# Patient Record
Sex: Female | Born: 1960 | Race: White | Hispanic: No | Smoking: Former smoker
Health system: Southern US, Community
[De-identification: ages and names within clinical notes are randomized; demographics above are authoritative.]

## PROBLEM LIST (undated history)

## (undated) DIAGNOSIS — J449 Chronic obstructive pulmonary disease, unspecified: Secondary | ICD-10-CM

## (undated) DIAGNOSIS — I10 Essential (primary) hypertension: Secondary | ICD-10-CM

## (undated) DIAGNOSIS — E119 Type 2 diabetes mellitus without complications: Secondary | ICD-10-CM

## (undated) HISTORY — PX: ABDOMINAL HYSTERECTOMY: SHX81

## (undated) HISTORY — PX: KNEE SURGERY: SHX244

## (undated) HISTORY — PX: TONSILLECTOMY: SUR1361

---

## 2020-06-01 ENCOUNTER — Emergency Department (HOSPITAL_COMMUNITY): Payer: Medicare Other

## 2020-06-01 ENCOUNTER — Encounter (HOSPITAL_COMMUNITY): Payer: Self-pay

## 2020-06-01 ENCOUNTER — Inpatient Hospital Stay (HOSPITAL_COMMUNITY)
Admission: EM | Admit: 2020-06-01 | Discharge: 2020-06-10 | DRG: 935 | Disposition: A | Discharge status: Skilled Nursing Facility | Payer: Medicare Other | Attending: Surgery | Admitting: Surgery

## 2020-06-01 DIAGNOSIS — E119 Type 2 diabetes mellitus without complications: Secondary | ICD-10-CM | POA: Diagnosis present

## 2020-06-01 DIAGNOSIS — T300 Burn of unspecified body region, unspecified degree: Secondary | ICD-10-CM | POA: Diagnosis not present

## 2020-06-01 DIAGNOSIS — I10 Essential (primary) hypertension: Secondary | ICD-10-CM | POA: Diagnosis present

## 2020-06-01 DIAGNOSIS — T2029XA Burn of second degree of multiple sites of head, face, and neck, initial encounter: Principal | ICD-10-CM | POA: Diagnosis present

## 2020-06-01 DIAGNOSIS — Z6841 Body Mass Index (BMI) 40.0 and over, adult: Secondary | ICD-10-CM

## 2020-06-01 DIAGNOSIS — Z23 Encounter for immunization: Secondary | ICD-10-CM

## 2020-06-01 DIAGNOSIS — Z9981 Dependence on supplemental oxygen: Secondary | ICD-10-CM

## 2020-06-01 DIAGNOSIS — S0501XA Injury of conjunctiva and corneal abrasion without foreign body, right eye, initial encounter: Secondary | ICD-10-CM | POA: Diagnosis present

## 2020-06-01 DIAGNOSIS — R300 Dysuria: Secondary | ICD-10-CM | POA: Diagnosis not present

## 2020-06-01 DIAGNOSIS — J449 Chronic obstructive pulmonary disease, unspecified: Secondary | ICD-10-CM | POA: Diagnosis present

## 2020-06-01 DIAGNOSIS — H548 Legal blindness, as defined in USA: Secondary | ICD-10-CM | POA: Diagnosis present

## 2020-06-01 DIAGNOSIS — Z01818 Encounter for other preprocedural examination: Secondary | ICD-10-CM

## 2020-06-01 DIAGNOSIS — T310 Burns involving less than 10% of body surface: Secondary | ICD-10-CM | POA: Diagnosis present

## 2020-06-01 DIAGNOSIS — N179 Acute kidney failure, unspecified: Secondary | ICD-10-CM | POA: Diagnosis present

## 2020-06-01 DIAGNOSIS — Z20822 Contact with and (suspected) exposure to covid-19: Secondary | ICD-10-CM | POA: Diagnosis present

## 2020-06-01 DIAGNOSIS — Z90710 Acquired absence of both cervix and uterus: Secondary | ICD-10-CM

## 2020-06-01 DIAGNOSIS — T2030XA Burn of third degree of head, face, and neck, unspecified site, initial encounter: Secondary | ICD-10-CM

## 2020-06-01 DIAGNOSIS — J9622 Acute and chronic respiratory failure with hypercapnia: Secondary | ICD-10-CM | POA: Diagnosis not present

## 2020-06-01 DIAGNOSIS — Z4659 Encounter for fitting and adjustment of other gastrointestinal appliance and device: Secondary | ICD-10-CM

## 2020-06-01 DIAGNOSIS — J9611 Chronic respiratory failure with hypoxia: Secondary | ICD-10-CM | POA: Diagnosis present

## 2020-06-01 DIAGNOSIS — Z87891 Personal history of nicotine dependence: Secondary | ICD-10-CM

## 2020-06-01 HISTORY — DX: Essential (primary) hypertension: I10

## 2020-06-01 HISTORY — DX: Chronic obstructive pulmonary disease, unspecified: J44.9

## 2020-06-01 HISTORY — DX: Type 2 diabetes mellitus without complications: E11.9

## 2020-06-01 LAB — PROTIME-INR
INR: 1 (ref 0.8–1.2)
Prothrombin Time: 12.7 seconds (ref 11.4–15.2)

## 2020-06-01 LAB — I-STAT CHEM 8, ED
BUN: 26 mg/dL — ABNORMAL HIGH (ref 6–20)
Calcium, Ion: 1.11 mmol/L — ABNORMAL LOW (ref 1.15–1.40)
Chloride: 99 mmol/L (ref 98–111)
Creatinine, Ser: 1.2 mg/dL — ABNORMAL HIGH (ref 0.44–1.00)
Glucose, Bld: 121 mg/dL — ABNORMAL HIGH (ref 70–99)
HCT: 37 % (ref 36.0–46.0)
Hemoglobin: 12.6 g/dL (ref 12.0–15.0)
Potassium: 4.5 mmol/L (ref 3.5–5.1)
Sodium: 141 mmol/L (ref 135–145)
TCO2: 33 mmol/L — ABNORMAL HIGH (ref 22–32)

## 2020-06-01 LAB — CBC
HCT: 40.5 % (ref 36.0–46.0)
Hemoglobin: 12 g/dL (ref 12.0–15.0)
MCH: 28.4 pg (ref 26.0–34.0)
MCHC: 29.6 g/dL — ABNORMAL LOW (ref 30.0–36.0)
MCV: 95.7 fL (ref 80.0–100.0)
Platelets: 145 10*3/uL — ABNORMAL LOW (ref 150–400)
RBC: 4.23 MIL/uL (ref 3.87–5.11)
RDW: 13.6 % (ref 11.5–15.5)
WBC: 11.1 10*3/uL — ABNORMAL HIGH (ref 4.0–10.5)
nRBC: 0 % (ref 0.0–0.2)

## 2020-06-01 LAB — COMPREHENSIVE METABOLIC PANEL
ALT: 17 U/L (ref 0–44)
AST: 15 U/L (ref 15–41)
Albumin: 3.3 g/dL — ABNORMAL LOW (ref 3.5–5.0)
Alkaline Phosphatase: 96 U/L (ref 38–126)
Anion gap: 10 (ref 5–15)
BUN: 21 mg/dL — ABNORMAL HIGH (ref 6–20)
CO2: 31 mmol/L (ref 22–32)
Calcium: 8.8 mg/dL — ABNORMAL LOW (ref 8.9–10.3)
Chloride: 99 mmol/L (ref 98–111)
Creatinine, Ser: 1.14 mg/dL — ABNORMAL HIGH (ref 0.44–1.00)
GFR, Estimated: 55 mL/min — ABNORMAL LOW (ref >60)
Glucose, Bld: 128 mg/dL — ABNORMAL HIGH (ref 70–99)
Potassium: 4.5 mmol/L (ref 3.5–5.1)
Sodium: 140 mmol/L (ref 135–145)
Total Bilirubin: 0.6 mg/dL (ref 0.3–1.2)
Total Protein: 6.7 g/dL (ref 6.5–8.1)

## 2020-06-01 LAB — ETHANOL: Alcohol, Ethyl (B): <10 mg/dL (ref <10)

## 2020-06-01 LAB — LACTIC ACID, PLASMA: Lactic Acid, Venous: 1.2 mmol/L (ref 0.5–1.9)

## 2020-06-01 LAB — SAMPLE TO BLOOD BANK

## 2020-06-01 LAB — SARS CORONAVIRUS 2 BY RT PCR (HOSPITAL ORDER, PERFORMED IN ~~LOC~~ HOSPITAL LAB): SARS Coronavirus 2: NEGATIVE

## 2020-06-01 MED ORDER — ONDANSETRON HCL 4 MG/2ML IJ SOLN
4.0000 mg | Freq: Four times a day (QID) | INTRAMUSCULAR | Status: DC | PRN
  Administered 2020-06-02 – 2020-06-10 (×8): 4 mg via INTRAVENOUS
  Filled 2020-06-01 (×9): qty 2

## 2020-06-01 MED ORDER — ENOXAPARIN SODIUM 30 MG/0.3ML ~~LOC~~ SOLN: 30.0000 mg | Freq: Two times a day (BID) | SUBCUTANEOUS | Status: DC

## 2020-06-01 MED ORDER — LISINOPRIL 10 MG PO TABS
10.0000 mg | ORAL_TABLET | Freq: Every day | ORAL | Status: DC
  Administered 2020-06-02 – 2020-06-04 (×3): 10 mg via ORAL
  Filled 2020-06-01 (×3): qty 1

## 2020-06-01 MED ORDER — ATORVASTATIN CALCIUM 10 MG PO TABS
10.0000 mg | ORAL_TABLET | Freq: Every day | ORAL | Status: DC
  Administered 2020-06-02 – 2020-06-04 (×4): 10 mg via ORAL
  Filled 2020-06-01 (×4): qty 1

## 2020-06-01 MED ORDER — UMECLIDINIUM BROMIDE 62.5 MCG/INH IN AEPB
1.0000 | INHALATION_SPRAY | Freq: Every day | RESPIRATORY_TRACT | Status: DC
  Filled 2020-06-01 (×2): qty 7

## 2020-06-01 MED ORDER — HYDROMORPHONE HCL 1 MG/ML IJ SOLN
0.5000 mg | INTRAMUSCULAR | Status: DC | PRN
  Administered 2020-06-01 – 2020-06-05 (×9): 0.5 mg via INTRAVENOUS
  Filled 2020-06-01 (×10): qty 1

## 2020-06-01 MED ORDER — MIRABEGRON ER 50 MG PO TB24
50.0000 mg | ORAL_TABLET | Freq: Every day | ORAL | Status: DC
  Administered 2020-06-03 – 2020-06-04 (×2): 50 mg via ORAL
  Filled 2020-06-01 (×4): qty 1

## 2020-06-01 MED ORDER — OXYCODONE HCL 5 MG PO TABS
5.0000 mg | ORAL_TABLET | ORAL | Status: DC | PRN
  Administered 2020-06-01 – 2020-06-02 (×4): 5 mg via ORAL
  Filled 2020-06-01 (×4): qty 1

## 2020-06-01 MED ORDER — TETANUS-DIPHTH-ACELL PERTUSSIS 5-2.5-18.5 LF-MCG/0.5 IM SUSY
0.5000 mL | PREFILLED_SYRINGE | Freq: Once | INTRAMUSCULAR | Status: AC
  Administered 2020-06-01: 0.5 mL via INTRAMUSCULAR
  Filled 2020-06-01: qty 0.5

## 2020-06-01 MED ORDER — ACETAMINOPHEN 325 MG PO TABS
650.0000 mg | ORAL_TABLET | Freq: Four times a day (QID) | ORAL | Status: DC
  Administered 2020-06-01 – 2020-06-02 (×2): 650 mg via ORAL
  Filled 2020-06-01 (×2): qty 2

## 2020-06-01 MED ORDER — ONDANSETRON 4 MG PO TBDP
4.0000 mg | ORAL_TABLET | Freq: Four times a day (QID) | ORAL | Status: DC | PRN
  Administered 2020-06-09: 4 mg via ORAL
  Filled 2020-06-01: qty 1

## 2020-06-01 MED ORDER — METOPROLOL TARTRATE 5 MG/5ML IV SOLN: 5.0000 mg | Freq: Four times a day (QID) | INTRAVENOUS | Status: DC | PRN

## 2020-06-01 MED ORDER — INSULIN ASPART 100 UNIT/ML ~~LOC~~ SOLN
0.0000 [IU] | Freq: Three times a day (TID) | SUBCUTANEOUS | Status: DC
  Administered 2020-06-03 – 2020-06-09 (×5): 3 [IU] via SUBCUTANEOUS

## 2020-06-01 MED ORDER — BACITRACIN ZINC 500 UNIT/GM EX OINT
TOPICAL_OINTMENT | Freq: Two times a day (BID) | CUTANEOUS | Status: DC
  Administered 2020-06-02 – 2020-06-08 (×5): 1 via TOPICAL
  Filled 2020-06-01 (×2): qty 28.4
  Filled 2020-06-01: qty 0.9
  Filled 2020-06-01 (×2): qty 28.4
  Filled 2020-06-01: qty 1.8

## 2020-06-01 MED ORDER — ALBUTEROL SULFATE HFA 108 (90 BASE) MCG/ACT IN AERS
2.0000 | INHALATION_SPRAY | RESPIRATORY_TRACT | Status: DC | PRN
  Filled 2020-06-01: qty 6.7

## 2020-06-01 MED ORDER — ALBUTEROL SULFATE HFA 108 (90 BASE) MCG/ACT IN AERS
4.0000 | INHALATION_SPRAY | Freq: Once | RESPIRATORY_TRACT | Status: AC
  Administered 2020-06-01: 4 via RESPIRATORY_TRACT
  Filled 2020-06-01: qty 6.7

## 2020-06-01 MED ORDER — FLUTICASONE FUROATE-VILANTEROL 100-25 MCG/INH IN AEPB
1.0000 | INHALATION_SPRAY | Freq: Every day | RESPIRATORY_TRACT | Status: DC
  Filled 2020-06-01: qty 28

## 2020-06-01 MED ORDER — HYDROXYZINE HCL 25 MG PO TABS
25.0000 mg | ORAL_TABLET | Freq: Three times a day (TID) | ORAL | Status: DC | PRN
  Administered 2020-06-02: 25 mg via ORAL
  Filled 2020-06-01 (×2): qty 1

## 2020-06-01 MED ORDER — DOCUSATE SODIUM 100 MG PO CAPS
100.0000 mg | ORAL_CAPSULE | Freq: Two times a day (BID) | ORAL | Status: DC
  Administered 2020-06-02 – 2020-06-04 (×5): 100 mg via ORAL
  Filled 2020-06-01 (×7): qty 1

## 2020-06-01 MED ORDER — DOXEPIN HCL 10 MG PO CAPS
10.0000 mg | ORAL_CAPSULE | Freq: Every day | ORAL | Status: DC
  Administered 2020-06-01 – 2020-06-03 (×3): 10 mg via ORAL
  Filled 2020-06-01 (×4): qty 1

## 2020-06-01 MED ORDER — HYDROXYZINE PAMOATE 25 MG PO CAPS
25.0000 mg | ORAL_CAPSULE | Freq: Three times a day (TID) | ORAL | Status: DC | PRN
  Filled 2020-06-01: qty 1

## 2020-06-01 NOTE — Progress Notes (Signed)
   06/01/20 1905  Clinical Encounter Type  Visited With Patient not available  Visit Type Trauma  Referral From Nurse  Consult/Referral To Chaplain  Chaplain responded. Patient was not available and there was no family present. Chaplain informed Licensed conveyancer that Lunette Stands is available if needed. This note was prepared by Deneen Harts, M.Div..  For questions please contact by phone [phone].
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
## 2020-06-02 DIAGNOSIS — J9622 Acute and chronic respiratory failure with hypercapnia: Secondary | ICD-10-CM | POA: Diagnosis not present

## 2020-06-02 DIAGNOSIS — I10 Essential (primary) hypertension: Secondary | ICD-10-CM | POA: Diagnosis not present

## 2020-06-02 DIAGNOSIS — Z23 Encounter for immunization: Secondary | ICD-10-CM | POA: Diagnosis not present

## 2020-06-02 DIAGNOSIS — Z90710 Acquired absence of both cervix and uterus: Secondary | ICD-10-CM | POA: Diagnosis not present

## 2020-06-02 DIAGNOSIS — H548 Legal blindness, as defined in USA: Secondary | ICD-10-CM | POA: Diagnosis present

## 2020-06-02 DIAGNOSIS — T2029XA Burn of second degree of multiple sites of head, face, and neck, initial encounter: Secondary | ICD-10-CM | POA: Diagnosis not present

## 2020-06-02 DIAGNOSIS — Z20822 Contact with and (suspected) exposure to covid-19: Secondary | ICD-10-CM | POA: Diagnosis not present

## 2020-06-02 DIAGNOSIS — T310 Burns involving less than 10% of body surface: Secondary | ICD-10-CM | POA: Diagnosis present

## 2020-06-02 DIAGNOSIS — N179 Acute kidney failure, unspecified: Secondary | ICD-10-CM | POA: Diagnosis not present

## 2020-06-02 DIAGNOSIS — E119 Type 2 diabetes mellitus without complications: Secondary | ICD-10-CM | POA: Diagnosis not present

## 2020-06-02 DIAGNOSIS — Z9981 Dependence on supplemental oxygen: Secondary | ICD-10-CM | POA: Diagnosis not present

## 2020-06-02 DIAGNOSIS — R300 Dysuria: Secondary | ICD-10-CM | POA: Diagnosis not present

## 2020-06-02 DIAGNOSIS — T300 Burn of unspecified body region, unspecified degree: Secondary | ICD-10-CM | POA: Diagnosis present

## 2020-06-02 DIAGNOSIS — S0501XA Injury of conjunctiva and corneal abrasion without foreign body, right eye, initial encounter: Secondary | ICD-10-CM | POA: Diagnosis present

## 2020-06-02 DIAGNOSIS — J9611 Chronic respiratory failure with hypoxia: Secondary | ICD-10-CM | POA: Diagnosis not present

## 2020-06-02 DIAGNOSIS — Z87891 Personal history of nicotine dependence: Secondary | ICD-10-CM | POA: Diagnosis not present

## 2020-06-02 DIAGNOSIS — J449 Chronic obstructive pulmonary disease, unspecified: Secondary | ICD-10-CM | POA: Diagnosis not present

## 2020-06-02 DIAGNOSIS — Z6841 Body Mass Index (BMI) 40.0 and over, adult: Secondary | ICD-10-CM | POA: Diagnosis not present

## 2020-06-02 LAB — MRSA PCR SCREENING: MRSA by PCR: POSITIVE — AB

## 2020-06-02 LAB — URINALYSIS, ROUTINE W REFLEX MICROSCOPIC
Bilirubin Urine: NEGATIVE
Glucose, UA: NEGATIVE mg/dL
Ketones, ur: NEGATIVE mg/dL
Nitrite: POSITIVE — AB
Protein, ur: NEGATIVE mg/dL
Specific Gravity, Urine: 1.021 (ref 1.005–1.030)
WBC, UA: >50 WBC/hpf — ABNORMAL HIGH (ref 0–5)
pH: 5 (ref 5.0–8.0)

## 2020-06-02 LAB — HEMOGLOBIN A1C
Hgb A1c MFr Bld: 6 % — ABNORMAL HIGH (ref 4.8–5.6)
Mean Plasma Glucose: 125.5 mg/dL

## 2020-06-02 LAB — CBG MONITORING, ED
Glucose-Capillary: 100 mg/dL — ABNORMAL HIGH (ref 70–99)
Glucose-Capillary: 110 mg/dL — ABNORMAL HIGH (ref 70–99)

## 2020-06-02 LAB — GLUCOSE, CAPILLARY: Glucose-Capillary: 110 mg/dL — ABNORMAL HIGH (ref 70–99)

## 2020-06-02 MED ORDER — SODIUM CHLORIDE 0.9 % IV SOLN: INTRAVENOUS | Status: DC

## 2020-06-02 MED ORDER — PANTOPRAZOLE SODIUM 40 MG PO TBEC
40.0000 mg | DELAYED_RELEASE_TABLET | Freq: Every day | ORAL | Status: DC
  Administered 2020-06-02 – 2020-06-04 (×3): 40 mg via ORAL
  Filled 2020-06-02 (×3): qty 1

## 2020-06-02 MED ORDER — MUPIROCIN 2 % EX OINT
1.0000 "application " | TOPICAL_OINTMENT | Freq: Two times a day (BID) | CUTANEOUS | Status: AC
  Administered 2020-06-02 – 2020-06-07 (×10): 1 via NASAL
  Filled 2020-06-02 (×2): qty 22

## 2020-06-02 MED ORDER — ENOXAPARIN SODIUM 100 MG/ML ~~LOC~~ SOLN
0.5000 mg/kg | SUBCUTANEOUS | Status: DC
  Administered 2020-06-02 – 2020-06-10 (×9): 85 mg via SUBCUTANEOUS
  Filled 2020-06-02: qty 1
  Filled 2020-06-02: qty 0.85
  Filled 2020-06-02 (×2): qty 1
  Filled 2020-06-02 (×2): qty 0.85
  Filled 2020-06-02: qty 1
  Filled 2020-06-02: qty 0.85
  Filled 2020-06-02: qty 1

## 2020-06-02 MED ORDER — IBUPROFEN 200 MG PO TABS
800.0000 mg | ORAL_TABLET | Freq: Three times a day (TID) | ORAL | Status: DC
  Administered 2020-06-02 – 2020-06-03 (×4): 800 mg via ORAL
  Filled 2020-06-02: qty 1
  Filled 2020-06-02: qty 4
  Filled 2020-06-02: qty 1
  Filled 2020-06-02: qty 4

## 2020-06-02 MED ORDER — OXYCODONE HCL 5 MG PO TABS
5.0000 mg | ORAL_TABLET | ORAL | Status: DC | PRN
  Administered 2020-06-02 – 2020-06-04 (×8): 10 mg via ORAL
  Filled 2020-06-02 (×9): qty 2

## 2020-06-02 MED ORDER — ACETAMINOPHEN 500 MG PO TABS
1000.0000 mg | ORAL_TABLET | Freq: Four times a day (QID) | ORAL | Status: DC
  Administered 2020-06-02 – 2020-06-04 (×10): 1000 mg via ORAL
  Filled 2020-06-02 (×11): qty 2

## 2020-06-02 MED ORDER — CHLORHEXIDINE GLUCONATE CLOTH 2 % EX PADS
6.0000 | MEDICATED_PAD | Freq: Every day | CUTANEOUS | Status: AC
  Administered 2020-06-03 – 2020-06-07 (×4): 6 via TOPICAL
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
## 2020-06-02 NOTE — ED Notes (Signed)
Saline soaked gauze changed, ice pack applied to pts right eye for swelling.
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
## 2020-06-02 NOTE — Discharge Instructions (Signed)
Burn Care, Adult A burn is an injury to the skin or the tissues under the skin. There are three types of burns:  First degree. These burns may cause the skin to be red and a bit swollen.  Second degree. These burns are very painful and cause the skin to be very red. The skin may also swell, leak fluid, look shiny, and start to have blisters.  Third degree. These burns cause lasting damage. They turn the skin white or black and make it look charred, dry, and leathery. Treatment for your burn will depend on the type of burn you have. Taking good care of your burn can help to prevent pain and infection. It can also help the burn heal quickly. How to care for a first-degree burn Right after a burn:  Rinse or soak the burn under cool water for 5 minutes or more. Do not put ice on your burn. That can cause more damage.  Put a cool, clean, wet cloth on your burn.  Put lotion or gel with aloe vera on your burn. Caring for the burn Clean and care for your burn. Your doctor may tell you:  To clean the burn using soap and water.  To pat the burn dry using a clean cloth. Do not rub or scrub the burn.  To put lotion or gel with aloe vera on your burn. How to care for a second-degree burn Right after a burn:  Rinse or soak the burn under cool water. Do this for 5 to 10 minutes. Do not put ice on your burn. This can cause more damage.  Remove any jewelry near the burned area.  Cover the burn with a clean cloth. Caring for the burn  Raise (elevate) the burned area above the level of your heart while sitting or lying down.  Clean and care for your burn. Your doctor may tell you: ? To clean or rinse your burn. ? To put a cream or ointment on the burn. ? To place a germ-free (sterile) dressing over the burn. A dressing is a material that is placed on a burn to help it heal. How to care for a third-degree burn Right after a burn:  Cover the burn with a clean, dry cloth.  Seek treatment  right away if you have this kind of burn. You may: ? Need to stay in the hospital. ? Have surgery to remove burned tissue. ? Have surgery to put new skin on the burned area. ? Be given fluids through an IV tube. Caring for the burn Clean and care for your burn. Your doctor may tell you:  To clean or rinse your burn.  To put a cream or ointment on the burn.  To put a sterile dressing in the burn. This is called packing.  To place a sterile dressing over the burn. Other things to do  Raise the burned area above the level of your heart while sitting or lying down.  Wear splints or immobilizers if told by your doctor.  Rest as told by your doctor. Do not do sports or other activities until your doctor approves. How to prevent infection when caring for a burn  Take these steps to prevent infection: ? Wash your hands with soap and water for at least 20 seconds before and after caring for your burn. If you cannot use soap and water, use hand sanitizer. ? Wear clean or sterile gloves as told by your doctor. ? Do not put butter, oil,   toothpaste, or other home remedies on the burn. ? Do not scratch or pick at the burn. ? Do not break any blisters. ? Do not peel the skin. ? Do not rub your burn, even when you are cleaning it.  Check your burn every day for these signs of infection: ? More redness, swelling, or pain. ? Warmth. ? Pus or a bad smell. ? Red streaks around the burn.   Follow these instructions at home Medicines  Take over-the-counter and prescription medicines only as told by your doctor.  If you were prescribed an antibiotic medicine, use it as told by your doctor. Do not stop using the antibiotic even if your condition gets better.  Your doctor may ask you to take medicine for pain before you change your dressing. General instructions  Protect your burn from the sun.  Drink enough fluid to keep your pee (urine) pale yellow.  Do not use any products that contain  nicotine or tobacco, such as cigarettes, e-cigarettes, and chewing tobacco. These can delay healing. If you need help quitting, ask your doctor.  Keep all follow-up visits as told by your doctor. This is important.   Contact a doctor if:  Your condition does not get better.  Your condition gets worse.  You have a fever or chills.  Your burn feels warm to the touch.  You have more redness, swelling, or pain on your burn.  Your burn looks different or starts to have black or red spots on it.  Your pain does not get better with medicine. Get help right away if:  You have more fluid, blood, or pus coming from your burn.  You have red streaks near the burn.  You have very bad pain. Summary  There are three types of burns. They are first degree, second degree, and third degree. Of these, a third-degree burn is most serious. This must be treated right away.  Treatment for your burn will depend on the type of burn you have.  Do not put butter, oil, toothpaste, or other home remedies on the burn. These things can damage your skin.  Follow instructions from your doctor about how to clean and take care of your burn. This information is not intended to replace advice given to you by your health care provider. Make sure you discuss any questions you have with your health care provider. Document Revised: 05/31/2019 Document Reviewed: 01/29/2019 Elsevier Patient Education  2021 [employer]  

## 2020-06-02 NOTE — ED Notes (Signed)
SDU 

## 2020-06-02 NOTE — ED Notes (Signed)
Pt transferred to bariatric bed for comfort.
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
## 2020-06-02 NOTE — Progress Notes (Signed)
Patient arrived to the unit. VS are stable. Belongings at the bed side include pocketbook bedroom shoes, medication, and cell phone.
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
## 2020-06-02 NOTE — ED Notes (Signed)
Lunch Tray Ordered @ 1036. 

## 2020-06-03 LAB — CBC
HCT: 36.6 % (ref 36.0–46.0)
Hemoglobin: 11.3 g/dL — ABNORMAL LOW (ref 12.0–15.0)
MCH: 29.3 pg (ref 26.0–34.0)
MCHC: 30.9 g/dL (ref 30.0–36.0)
MCV: 94.8 fL (ref 80.0–100.0)
Platelets: 143 10*3/uL — ABNORMAL LOW (ref 150–400)
RBC: 3.86 MIL/uL — ABNORMAL LOW (ref 3.87–5.11)
RDW: 13.9 % (ref 11.5–15.5)
WBC: 7.8 10*3/uL (ref 4.0–10.5)
nRBC: 0 % (ref 0.0–0.2)

## 2020-06-03 LAB — BASIC METABOLIC PANEL
Anion gap: 9 (ref 5–15)
BUN: 22 mg/dL — ABNORMAL HIGH (ref 6–20)
CO2: 28 mmol/L (ref 22–32)
Calcium: 8.6 mg/dL — ABNORMAL LOW (ref 8.9–10.3)
Chloride: 101 mmol/L (ref 98–111)
Creatinine, Ser: 1.3 mg/dL — ABNORMAL HIGH (ref 0.44–1.00)
GFR, Estimated: 47 mL/min — ABNORMAL LOW (ref >60)
Glucose, Bld: 112 mg/dL — ABNORMAL HIGH (ref 70–99)
Potassium: 4.7 mmol/L (ref 3.5–5.1)
Sodium: 138 mmol/L (ref 135–145)

## 2020-06-03 LAB — GLUCOSE, CAPILLARY
Glucose-Capillary: 114 mg/dL — ABNORMAL HIGH (ref 70–99)
Glucose-Capillary: 133 mg/dL — ABNORMAL HIGH (ref 70–99)
Glucose-Capillary: 100 mg/dL — ABNORMAL HIGH (ref 70–99)
Glucose-Capillary: 121 mg/dL — ABNORMAL HIGH (ref 70–99)

## 2020-06-03 MED ORDER — OFLOXACIN 0.3 % OP SOLN
1.0000 [drp] | Freq: Four times a day (QID) | OPHTHALMIC | Status: DC
  Administered 2020-06-03 – 2020-06-10 (×29): 1 [drp] via OPHTHALMIC
  Filled 2020-06-03 (×2): qty 5
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
## 2020-06-04 LAB — GLUCOSE, CAPILLARY
Glucose-Capillary: 102 mg/dL — ABNORMAL HIGH (ref 70–99)
Glucose-Capillary: 92 mg/dL (ref 70–99)
Glucose-Capillary: 133 mg/dL — ABNORMAL HIGH (ref 70–99)
Glucose-Capillary: 117 mg/dL — ABNORMAL HIGH (ref 70–99)

## 2020-06-04 LAB — BASIC METABOLIC PANEL
Anion gap: 9 (ref 5–15)
BUN: 22 mg/dL — ABNORMAL HIGH (ref 6–20)
CO2: 26 mmol/L (ref 22–32)
Calcium: 8.2 mg/dL — ABNORMAL LOW (ref 8.9–10.3)
Chloride: 103 mmol/L (ref 98–111)
Creatinine, Ser: 1.44 mg/dL — ABNORMAL HIGH (ref 0.44–1.00)
GFR, Estimated: 42 mL/min — ABNORMAL LOW (ref >60)
Glucose, Bld: 108 mg/dL — ABNORMAL HIGH (ref 70–99)
Potassium: 4.5 mmol/L (ref 3.5–5.1)
Sodium: 138 mmol/L (ref 135–145)

## 2020-06-04 LAB — CBC
HCT: 38.2 % (ref 36.0–46.0)
Hemoglobin: 11.2 g/dL — ABNORMAL LOW (ref 12.0–15.0)
MCH: 28.4 pg (ref 26.0–34.0)
MCHC: 29.3 g/dL — ABNORMAL LOW (ref 30.0–36.0)
MCV: 96.7 fL (ref 80.0–100.0)
Platelets: 111 10*3/uL — ABNORMAL LOW (ref 150–400)
RBC: 3.95 MIL/uL (ref 3.87–5.11)
RDW: 13.7 % (ref 11.5–15.5)
WBC: 8.6 10*3/uL (ref 4.0–10.5)
nRBC: 0 % (ref 0.0–0.2)

## 2020-06-04 MED ORDER — BOOST / RESOURCE BREEZE PO LIQD CUSTOM
1.0000 | Freq: Three times a day (TID) | ORAL | Status: DC
  Administered 2020-06-04 (×2): 1 via ORAL

## 2020-06-04 MED ORDER — SACCHAROMYCES BOULARDII 250 MG PO CAPS
250.0000 mg | ORAL_CAPSULE | Freq: Two times a day (BID) | ORAL | Status: DC
  Administered 2020-06-04 – 2020-06-10 (×12): 250 mg via ORAL
  Filled 2020-06-04 (×14): qty 1

## 2020-06-04 MED ORDER — WHITE PETROLATUM EX OINT
TOPICAL_OINTMENT | CUTANEOUS | Status: AC
  Filled 2020-06-04: qty 28.35

## 2020-06-04 MED ORDER — GABAPENTIN 600 MG PO TABS
300.0000 mg | ORAL_TABLET | Freq: Three times a day (TID) | ORAL | Status: DC
  Administered 2020-06-04 (×3): 300 mg via ORAL
  Filled 2020-06-04: qty 1
  Filled 2020-06-04: qty 0.5
  Filled 2020-06-04 (×2): qty 1
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
## 2020-06-04 NOTE — Progress Notes (Addendum)
2115=Nurse walked into patients room to give schedule medication. Patient was not answering orientation questions, shaking throughout body, purplish in the face and sating in the 70s with a face tent on 5L fio2 at 28%.   Nurse called in charge nurse who was able to get patient to then respond to orientation questions after much prompting. Can do a focused assessment.  2130-Reposition patient, increase o2 to 10L and fio2 to 40% patient is currently sating at 94 and color in face has returned to normal skin color. Patient now A&Ox3 (person, place and time). Still has some slight confusion.    2200-Nurse called on call trauma MD to make aware of new development. No new orders given at this time but just to closely monitor patient and update if this event happens again. Possible transfer to ICU.
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
## 2020-06-04 NOTE — Progress Notes (Signed)
Nurse noticed patients IV was out. Placed order for new IV.
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
## 2020-06-05 ENCOUNTER — Inpatient Hospital Stay (HOSPITAL_COMMUNITY): Payer: Medicare Other

## 2020-06-05 ENCOUNTER — Inpatient Hospital Stay (HOSPITAL_COMMUNITY): Payer: Medicare Other | Admitting: Certified Registered Nurse Anesthetist

## 2020-06-05 LAB — POCT I-STAT 7, (LYTES, BLD GAS, ICA,H+H)
Acid-Base Excess: 5 mmol/L — ABNORMAL HIGH (ref 0.0–2.0)
Acid-Base Excess: 5 mmol/L — ABNORMAL HIGH (ref 0.0–2.0)
Bicarbonate: 34.2 mmol/L — ABNORMAL HIGH (ref 20.0–28.0)
Bicarbonate: 30.8 mmol/L — ABNORMAL HIGH (ref 20.0–28.0)
Calcium, Ion: 1.24 mmol/L (ref 1.15–1.40)
Calcium, Ion: 1.2 mmol/L (ref 1.15–1.40)
HCT: 31 % — ABNORMAL LOW (ref 36.0–46.0)
HCT: 27 % — ABNORMAL LOW (ref 36.0–46.0)
Hemoglobin: 10.5 g/dL — ABNORMAL LOW (ref 12.0–15.0)
Hemoglobin: 9.2 g/dL — ABNORMAL LOW (ref 12.0–15.0)
O2 Saturation: 100 %
O2 Saturation: 95 %
Patient temperature: 98.3
Patient temperature: 98.3
Potassium: 5.3 mmol/L — ABNORMAL HIGH (ref 3.5–5.1)
Potassium: 4.9 mmol/L (ref 3.5–5.1)
Sodium: 138 mmol/L (ref 135–145)
Sodium: 127 mmol/L — ABNORMAL LOW (ref 135–145)
TCO2: 36 mmol/L — ABNORMAL HIGH (ref 22–32)
TCO2: 32 mmol/L (ref 22–32)
pCO2 arterial: 75.1 mmHg (ref 32.0–48.0)
pCO2 arterial: 51.5 mmHg — ABNORMAL HIGH (ref 32.0–48.0)
pH, Arterial: 7.265 — ABNORMAL LOW (ref 7.350–7.450)
pH, Arterial: 7.384 (ref 7.350–7.450)
pO2, Arterial: 397 mmHg — ABNORMAL HIGH (ref 83.0–108.0)
pO2, Arterial: 80 mmHg — ABNORMAL LOW (ref 83.0–108.0)

## 2020-06-05 LAB — BLOOD GAS, ARTERIAL
Acid-Base Excess: 2.2 mmol/L — ABNORMAL HIGH (ref 0.0–2.0)
Bicarbonate: 30 mmol/L — ABNORMAL HIGH (ref 20.0–28.0)
Drawn by: 441351
FIO2: 60
O2 Saturation: 95.8 %
Patient temperature: 37.6
pCO2 arterial: 87.5 mmHg (ref 32.0–48.0)
pH, Arterial: 7.165 — CL (ref 7.350–7.450)
pO2, Arterial: 89.2 mmHg (ref 83.0–108.0)

## 2020-06-05 LAB — BASIC METABOLIC PANEL
Anion gap: 9 (ref 5–15)
BUN: 19 mg/dL (ref 6–20)
CO2: 26 mmol/L (ref 22–32)
Calcium: 8.8 mg/dL — ABNORMAL LOW (ref 8.9–10.3)
Chloride: 103 mmol/L (ref 98–111)
Creatinine, Ser: 1.33 mg/dL — ABNORMAL HIGH (ref 0.44–1.00)
GFR, Estimated: 46 mL/min — ABNORMAL LOW (ref >60)
Glucose, Bld: 133 mg/dL — ABNORMAL HIGH (ref 70–99)
Potassium: 5.3 mmol/L — ABNORMAL HIGH (ref 3.5–5.1)
Sodium: 138 mmol/L (ref 135–145)

## 2020-06-05 LAB — GLUCOSE, CAPILLARY
Glucose-Capillary: 104 mg/dL — ABNORMAL HIGH (ref 70–99)
Glucose-Capillary: 107 mg/dL — ABNORMAL HIGH (ref 70–99)
Glucose-Capillary: 108 mg/dL — ABNORMAL HIGH (ref 70–99)
Glucose-Capillary: 122 mg/dL — ABNORMAL HIGH (ref 70–99)
Glucose-Capillary: 123 mg/dL — ABNORMAL HIGH (ref 70–99)

## 2020-06-05 LAB — CBC
HCT: 36.5 % (ref 36.0–46.0)
Hemoglobin: 11 g/dL — ABNORMAL LOW (ref 12.0–15.0)
MCH: 29.3 pg (ref 26.0–34.0)
MCHC: 30.1 g/dL (ref 30.0–36.0)
MCV: 97.3 fL (ref 80.0–100.0)
Platelets: 118 10*3/uL — ABNORMAL LOW (ref 150–400)
RBC: 3.75 MIL/uL — ABNORMAL LOW (ref 3.87–5.11)
RDW: 13.9 % (ref 11.5–15.5)
WBC: 9.2 10*3/uL (ref 4.0–10.5)
nRBC: 0 % (ref 0.0–0.2)

## 2020-06-05 LAB — MAGNESIUM: Magnesium: 1.9 mg/dL (ref 1.7–2.4)

## 2020-06-05 LAB — PHOSPHORUS: Phosphorus: 1.6 mg/dL — ABNORMAL LOW (ref 2.5–4.6)

## 2020-06-05 MED ORDER — FENTANYL CITRATE (PF) 100 MCG/2ML IJ SOLN: 50.0000 ug | Freq: Once | INTRAMUSCULAR | Status: DC

## 2020-06-05 MED ORDER — ORAL CARE MOUTH RINSE
15.0000 mL | Freq: Two times a day (BID) | OROMUCOSAL | Status: DC
  Administered 2020-06-05 – 2020-06-10 (×9): 15 mL via OROMUCOSAL

## 2020-06-05 MED ORDER — ROCURONIUM BROMIDE 100 MG/10ML IV SOLN
INTRAVENOUS | Status: DC | PRN
  Administered 2020-06-05: 30 mg via INTRAVENOUS

## 2020-06-05 MED ORDER — DOCUSATE SODIUM 50 MG/5ML PO LIQD: 100.0000 mg | Freq: Two times a day (BID) | ORAL | Status: DC

## 2020-06-05 MED ORDER — CHLORHEXIDINE GLUCONATE 0.12% ORAL RINSE (MEDLINE KIT)
15.0000 mL | Freq: Two times a day (BID) | OROMUCOSAL | Status: DC
  Administered 2020-06-05 (×2): 15 mL via OROMUCOSAL

## 2020-06-05 MED ORDER — FENTANYL CITRATE (PF) 100 MCG/2ML IJ SOLN
100.0000 ug | Freq: Once | INTRAMUSCULAR | Status: AC
Start: 2020-06-05 — End: 2020-06-05
  Administered 2020-06-05: 100 ug via INTRAVENOUS

## 2020-06-05 MED ORDER — VITAL HIGH PROTEIN PO LIQD: 1000.0000 mL | ORAL | Status: DC

## 2020-06-05 MED ORDER — VITAL HIGH PROTEIN PO LIQD: 1000.0000 mL | ORAL | Status: AC

## 2020-06-05 MED ORDER — SODIUM CHLORIDE 0.9 % IV SOLN: INTRAVENOUS | Status: DC | PRN

## 2020-06-05 MED ORDER — PROSOURCE TF PO LIQD
45.0000 mL | Freq: Two times a day (BID) | ORAL | Status: DC
  Administered 2020-06-05: 45 mL
  Filled 2020-06-05: qty 45

## 2020-06-05 MED ORDER — FENTANYL BOLUS VIA INFUSION
50.0000 ug | INTRAVENOUS | Status: DC | PRN
Start: 2020-06-05 — End: 2020-06-05
  Filled 2020-06-05: qty 50

## 2020-06-05 MED ORDER — DEXMEDETOMIDINE HCL IN NACL 400 MCG/100ML IV SOLN
0.0000 ug/kg/h | INTRAVENOUS | Status: DC
  Administered 2020-06-05 (×3): 0.5 ug/kg/h via INTRAVENOUS
  Filled 2020-06-05 (×3): qty 100

## 2020-06-05 MED ORDER — MIDAZOLAM HCL 2 MG/2ML IJ SOLN
INTRAMUSCULAR | Status: AC
  Filled 2020-06-05: qty 2

## 2020-06-05 MED ORDER — GABAPENTIN 250 MG/5ML PO SOLN
300.0000 mg | Freq: Three times a day (TID) | ORAL | Status: DC
  Administered 2020-06-05 (×2): 300 mg
  Filled 2020-06-05 (×3): qty 6

## 2020-06-05 MED ORDER — OXYCODONE HCL 5 MG/5ML PO SOLN: 10.0000 mg | ORAL | Status: DC | PRN

## 2020-06-05 MED ORDER — QUETIAPINE FUMARATE 25 MG PO TABS
50.0000 mg | ORAL_TABLET | Freq: Two times a day (BID) | ORAL | Status: DC
  Administered 2020-06-05: 50 mg
  Filled 2020-06-05: qty 2

## 2020-06-05 MED ORDER — IPRATROPIUM-ALBUTEROL 0.5-2.5 (3) MG/3ML IN SOLN
3.0000 mL | Freq: Four times a day (QID) | RESPIRATORY_TRACT | Status: DC
  Administered 2020-06-05 (×2): 3 mL via RESPIRATORY_TRACT
  Filled 2020-06-05 (×2): qty 3

## 2020-06-05 MED ORDER — POLYETHYLENE GLYCOL 3350 17 G PO PACK: 17.0000 g | PACK | Freq: Every day | ORAL | Status: DC

## 2020-06-05 MED ORDER — ORAL CARE MOUTH RINSE
15.0000 mL | OROMUCOSAL | Status: DC
  Administered 2020-06-05 (×5): 15 mL via OROMUCOSAL

## 2020-06-05 MED ORDER — FENTANYL 2500MCG IN NS 250ML (10MCG/ML) PREMIX INFUSION
50.0000 ug/h | INTRAVENOUS | Status: DC
  Filled 2020-06-05 (×2): qty 250

## 2020-06-05 MED ORDER — MIDAZOLAM HCL 2 MG/2ML IJ SOLN
2.0000 mg | Freq: Once | INTRAMUSCULAR | Status: AC
  Administered 2020-06-05: 2 mg via INTRAVENOUS

## 2020-06-05 MED ORDER — LISINOPRIL 10 MG PO TABS
10.0000 mg | ORAL_TABLET | Freq: Every day | ORAL | Status: DC
  Administered 2020-06-05: 10 mg
  Filled 2020-06-05: qty 1

## 2020-06-05 MED ORDER — GABAPENTIN 600 MG PO TABS
300.0000 mg | ORAL_TABLET | Freq: Three times a day (TID) | ORAL | Status: DC
  Filled 2020-06-05: qty 0.5

## 2020-06-05 MED ORDER — OXYCODONE HCL 5 MG/5ML PO SOLN
10.0000 mg | ORAL | Status: DC | PRN
Start: 2020-06-05 — End: 2020-06-05

## 2020-06-05 MED ORDER — ACETAMINOPHEN 500 MG PO TABS
1000.0000 mg | ORAL_TABLET | Freq: Four times a day (QID) | ORAL | Status: DC
  Administered 2020-06-05: 1000 mg
  Filled 2020-06-05 (×2): qty 2

## 2020-06-05 MED ORDER — VITAL HIGH PROTEIN PO LIQD
1000.0000 mL | ORAL | Status: DC
  Administered 2020-06-05: 1000 mL

## 2020-06-05 MED ORDER — FENTANYL CITRATE (PF) 100 MCG/2ML IJ SOLN
INTRAMUSCULAR | Status: AC
  Filled 2020-06-05: qty 2

## 2020-06-05 MED ORDER — DOXEPIN HCL 10 MG PO CAPS
10.0000 mg | ORAL_CAPSULE | Freq: Every day | ORAL | Status: DC
  Filled 2020-06-05: qty 1

## 2020-06-05 MED ORDER — ETOMIDATE 2 MG/ML IV SOLN
INTRAVENOUS | Status: DC | PRN
  Administered 2020-06-05: 10 mg via INTRAVENOUS

## 2020-06-05 MED ORDER — HYDROXYZINE HCL 25 MG PO TABS
25.0000 mg | ORAL_TABLET | Freq: Three times a day (TID) | ORAL | Status: DC | PRN
  Filled 2020-06-05: qty 1

## 2020-06-05 MED ORDER — PIVOT 1.5 CAL PO LIQD
1000.0000 mL | ORAL | Status: DC
  Administered 2020-06-05: 1000 mL

## 2020-06-05 MED ORDER — CLONAZEPAM 0.5 MG PO TABS
0.5000 mg | ORAL_TABLET | Freq: Two times a day (BID) | ORAL | Status: DC
  Administered 2020-06-05: 0.5 mg
  Filled 2020-06-05: qty 1
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
## 2020-06-05 NOTE — Progress Notes (Signed)
CRITICAL VALUE ALERT  Critical Value:  Pco2=87.5 and pH=7.165  Date & Time Notied:  06/05/2020 at 0550  Provider Notified: Kinsinger MD   Orders Received/Actions taken: MD Kinsinger consult respiratory for Bipap.

## 2020-06-05 NOTE — Progress Notes (Signed)
OT Cancellation Note  Patient Details Name: Olivia Guerrero MRN: [id number] DOB: Jan 21, 1961   Cancelled Treatment:    Reason Eval/Treat Not Completed: Patient not medically ready (Reintubated. Sedated.) Will return as schedule allows.   Ishmeal Rorie M Issabela Lesko Annalena Piatt MSOT, OTR/L Acute Rehab Pager: [phone] Office: [phone] 06/05/2020, 11:37 AM

## 2020-06-05 NOTE — Progress Notes (Signed)
Patient ID: Olivia Guerrero, female   DOB: 10-16-1960, 60 y.o.   MRN: [id number] I called her husband and updated him. He will be coming to see her.   [name], MD, MPH, FACS Please use [email or web address] to contact on call provider 06/05/2020 10:26 AM
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
## 2020-06-05 NOTE — Progress Notes (Addendum)
Upon doing the 0400/0500 rounds nurse noticed patient has not improved in mentation. Pt is still confused, delayed in answering orientation questions and slurred  Speech. Patient is sating at 57 with 10L of o2 with FiO2 of 40%  0500- Nurse page on call trauma MD to get ABG labs done.   510- MD call back and gave verbal order to get ABGs done. Awaiting results.

## 2020-06-05 NOTE — Anesthesia Procedure Notes (Signed)
Procedure Name: Intubation Date/Time: 06/05/2020 6:52 AM Performed by: Ciani Rutten T, CRNA Pre-anesthesia Checklist: Patient identified, Emergency Drugs available, Suction available and Patient being monitored Patient Re-evaluated:Patient Re-evaluated prior to induction Oxygen Delivery Method: Ambu bag Preoxygenation: Pre-oxygenation with 100% oxygen Induction Type: IV induction and Rapid sequence Ventilation: Mask ventilation without difficulty Laryngoscope Size: Glidescope and 4 Grade View: Grade I Tube type: Oral Tube size: 7.5 mm Number of attempts: 1 Airway Equipment and Method: Stylet,  Oral airway and Video-laryngoscopy Placement Confirmation: ETT inserted through vocal cords under direct vision,  positive ETCO2 and breath sounds checked- equal and bilateral Tube secured with: Tape Dental Injury: Teeth and Oropharynx as per pre-operative assessment

## 2020-06-06 LAB — PHOSPHORUS
Phosphorus: 2.4 mg/dL — ABNORMAL LOW (ref 2.5–4.6)
Phosphorus: 1.6 mg/dL — ABNORMAL LOW (ref 2.5–4.6)

## 2020-06-06 LAB — BASIC METABOLIC PANEL
Anion gap: 7 (ref 5–15)
BUN: 23 mg/dL — ABNORMAL HIGH (ref 6–20)
CO2: 26 mmol/L (ref 22–32)
Calcium: 8.5 mg/dL — ABNORMAL LOW (ref 8.9–10.3)
Chloride: 105 mmol/L (ref 98–111)
Creatinine, Ser: 1.17 mg/dL — ABNORMAL HIGH (ref 0.44–1.00)
GFR, Estimated: 53 mL/min — ABNORMAL LOW (ref >60)
Glucose, Bld: 118 mg/dL — ABNORMAL HIGH (ref 70–99)
Potassium: 5 mmol/L (ref 3.5–5.1)
Sodium: 138 mmol/L (ref 135–145)

## 2020-06-06 LAB — CBC
HCT: 35.3 % — ABNORMAL LOW (ref 36.0–46.0)
Hemoglobin: 10.5 g/dL — ABNORMAL LOW (ref 12.0–15.0)
MCH: 28.9 pg (ref 26.0–34.0)
MCHC: 29.7 g/dL — ABNORMAL LOW (ref 30.0–36.0)
MCV: 97.2 fL (ref 80.0–100.0)
Platelets: 117 10*3/uL — ABNORMAL LOW (ref 150–400)
RBC: 3.63 MIL/uL — ABNORMAL LOW (ref 3.87–5.11)
RDW: 13.5 % (ref 11.5–15.5)
WBC: 8.6 10*3/uL (ref 4.0–10.5)
nRBC: 0 % (ref 0.0–0.2)

## 2020-06-06 LAB — GLUCOSE, CAPILLARY
Glucose-Capillary: 98 mg/dL (ref 70–99)
Glucose-Capillary: 104 mg/dL — ABNORMAL HIGH (ref 70–99)
Glucose-Capillary: 105 mg/dL — ABNORMAL HIGH (ref 70–99)
Glucose-Capillary: 105 mg/dL — ABNORMAL HIGH (ref 70–99)
Glucose-Capillary: 107 mg/dL — ABNORMAL HIGH (ref 70–99)

## 2020-06-06 LAB — MAGNESIUM
Magnesium: 1.7 mg/dL (ref 1.7–2.4)
Magnesium: 1.6 mg/dL — ABNORMAL LOW (ref 1.7–2.4)

## 2020-06-06 MED ORDER — QUETIAPINE FUMARATE 25 MG PO TABS: 50.0000 mg | ORAL_TABLET | Freq: Two times a day (BID) | ORAL | Status: DC

## 2020-06-06 MED ORDER — ACETAMINOPHEN 500 MG PO TABS
1000.0000 mg | ORAL_TABLET | Freq: Four times a day (QID) | ORAL | Status: DC
  Administered 2020-06-06 – 2020-06-10 (×16): 1000 mg via ORAL
  Filled 2020-06-06 (×16): qty 2

## 2020-06-06 MED ORDER — POLYETHYLENE GLYCOL 3350 17 G PO PACK
17.0000 g | PACK | Freq: Every day | ORAL | Status: DC
  Administered 2020-06-06 – 2020-06-10 (×3): 17 g via ORAL
  Filled 2020-06-06 (×3): qty 1

## 2020-06-06 MED ORDER — OXYCODONE HCL 5 MG PO TABS
5.0000 mg | ORAL_TABLET | ORAL | Status: DC | PRN
  Administered 2020-06-06 (×5): 5 mg via ORAL
  Administered 2020-06-07: 10 mg via ORAL
  Administered 2020-06-07: 5 mg via ORAL
  Administered 2020-06-07 – 2020-06-10 (×14): 10 mg via ORAL
  Filled 2020-06-06: qty 1
  Filled 2020-06-06: qty 2
  Filled 2020-06-06: qty 1
  Filled 2020-06-06 (×2): qty 2
  Filled 2020-06-06: qty 1
  Filled 2020-06-06 (×3): qty 2
  Filled 2020-06-06 (×2): qty 1
  Filled 2020-06-06: qty 2
  Filled 2020-06-06: qty 1
  Filled 2020-06-06 (×8): qty 2

## 2020-06-06 MED ORDER — DOCUSATE SODIUM 100 MG PO CAPS
100.0000 mg | ORAL_CAPSULE | Freq: Two times a day (BID) | ORAL | Status: DC
  Administered 2020-06-07 – 2020-06-10 (×6): 100 mg via ORAL
  Filled 2020-06-06 (×6): qty 1

## 2020-06-06 MED ORDER — DOXEPIN HCL 10 MG PO CAPS
10.0000 mg | ORAL_CAPSULE | Freq: Every day | ORAL | Status: DC
  Administered 2020-06-06 – 2020-06-09 (×4): 10 mg via ORAL
  Filled 2020-06-06 (×5): qty 1

## 2020-06-06 MED ORDER — DOCUSATE SODIUM 50 MG/5ML PO LIQD
100.0000 mg | Freq: Two times a day (BID) | ORAL | Status: DC
  Administered 2020-06-06: 100 mg via ORAL
  Filled 2020-06-06: qty 10

## 2020-06-06 MED ORDER — LISINOPRIL 10 MG PO TABS
10.0000 mg | ORAL_TABLET | Freq: Every day | ORAL | Status: DC
Start: 2020-06-06 — End: 2020-06-11
  Administered 2020-06-06 – 2020-06-10 (×5): 10 mg via ORAL
  Filled 2020-06-06 (×5): qty 1
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
## 2020-06-06 NOTE — Progress Notes (Signed)
Pt c/o 9/10 pain and has no PRNs ordered, has scheduled tylenol but NPO since self extubating yesterday; notified Dr Dwain Sarna. Wants pt to remain NPO with sips to give Tylenol; no additional orders.

## 2020-06-06 NOTE — Progress Notes (Signed)
RT unable to collect ABG. Tried multiple times. Pt is stable on 35% face tent.

## 2020-06-07 LAB — CBC
HCT: 37 % (ref 36.0–46.0)
Hemoglobin: 11 g/dL — ABNORMAL LOW (ref 12.0–15.0)
MCH: 28.5 pg (ref 26.0–34.0)
MCHC: 29.7 g/dL — ABNORMAL LOW (ref 30.0–36.0)
MCV: 95.9 fL (ref 80.0–100.0)
Platelets: 140 10*3/uL — ABNORMAL LOW (ref 150–400)
RBC: 3.86 MIL/uL — ABNORMAL LOW (ref 3.87–5.11)
RDW: 13.7 % (ref 11.5–15.5)
WBC: 7.1 10*3/uL (ref 4.0–10.5)
nRBC: 0 % (ref 0.0–0.2)

## 2020-06-07 LAB — GLUCOSE, CAPILLARY
Glucose-Capillary: 145 mg/dL — ABNORMAL HIGH (ref 70–99)
Glucose-Capillary: 133 mg/dL — ABNORMAL HIGH (ref 70–99)
Glucose-Capillary: 101 mg/dL — ABNORMAL HIGH (ref 70–99)
Glucose-Capillary: 113 mg/dL — ABNORMAL HIGH (ref 70–99)

## 2020-06-07 LAB — PHOSPHORUS: Phosphorus: 2.1 mg/dL — ABNORMAL LOW (ref 2.5–4.6)

## 2020-06-07 LAB — BASIC METABOLIC PANEL
Anion gap: 11 (ref 5–15)
BUN: 14 mg/dL (ref 6–20)
CO2: 30 mmol/L (ref 22–32)
Calcium: 8.9 mg/dL (ref 8.9–10.3)
Chloride: 99 mmol/L (ref 98–111)
Creatinine, Ser: 0.92 mg/dL (ref 0.44–1.00)
GFR, Estimated: >60 mL/min (ref >60)
Glucose, Bld: 126 mg/dL — ABNORMAL HIGH (ref 70–99)
Potassium: 4.4 mmol/L (ref 3.5–5.1)
Sodium: 140 mmol/L (ref 135–145)

## 2020-06-07 LAB — MAGNESIUM: Magnesium: 1.6 mg/dL — ABNORMAL LOW (ref 1.7–2.4)

## 2020-06-07 MED ORDER — K PHOS MONO-SOD PHOS DI & MONO 155-852-130 MG PO TABS
500.0000 mg | ORAL_TABLET | Freq: Three times a day (TID) | ORAL | Status: AC
  Administered 2020-06-07 – 2020-06-08 (×3): 500 mg via ORAL
  Filled 2020-06-07 (×4): qty 2

## 2020-06-07 MED ORDER — MAGNESIUM SULFATE 2 GM/50ML IV SOLN
2.0000 g | Freq: Once | INTRAVENOUS | Status: AC
  Administered 2020-06-07: 2 g via INTRAVENOUS
  Filled 2020-06-07: qty 50

## 2020-06-07 MED ORDER — IPRATROPIUM-ALBUTEROL 0.5-2.5 (3) MG/3ML IN SOLN
3.0000 mL | Freq: Four times a day (QID) | RESPIRATORY_TRACT | Status: DC
  Administered 2020-06-07 – 2020-06-09 (×6): 3 mL via RESPIRATORY_TRACT
  Filled 2020-06-07 (×7): qty 3
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
## 2020-06-07 NOTE — Progress Notes (Signed)
Patient transferred to 4NP at this time. Report given to [facility].  Patient transferred with multiple belongings including clothes,slippers, 2 rings, a pocket book, cell phone, and laptop and charger.  Patient left with cardiac monitor in place, NSR 80's, and spo2 90-95% on 28% via face tent.   Blood pressure (!) 148/73, pulse 98, temperature 98.7 F (37.1 C), temperature source Oral, resp. rate 20, height 5\' 1"  (1.549 m), weight (!) 168 kg, SpO2 90 %.

## 2020-06-07 NOTE — Progress Notes (Signed)
Pt brought to 4NP11 from 4NICU. Report received from [place], [place]. Pt's vitals taken upon arrival and full assessment completed.   Robina Ade, RN

## 2020-06-08 LAB — CBC
HCT: 35.5 % — ABNORMAL LOW (ref 36.0–46.0)
Hemoglobin: 10.7 g/dL — ABNORMAL LOW (ref 12.0–15.0)
MCH: 28.2 pg (ref 26.0–34.0)
MCHC: 30.1 g/dL (ref 30.0–36.0)
MCV: 93.7 fL (ref 80.0–100.0)
Platelets: 157 10*3/uL (ref 150–400)
RBC: 3.79 MIL/uL — ABNORMAL LOW (ref 3.87–5.11)
RDW: 14 % (ref 11.5–15.5)
WBC: 5.5 10*3/uL (ref 4.0–10.5)
nRBC: 0 % (ref 0.0–0.2)

## 2020-06-08 LAB — BASIC METABOLIC PANEL
Anion gap: 9 (ref 5–15)
BUN: 9 mg/dL (ref 6–20)
CO2: 33 mmol/L — ABNORMAL HIGH (ref 22–32)
Calcium: 8.7 mg/dL — ABNORMAL LOW (ref 8.9–10.3)
Chloride: 98 mmol/L (ref 98–111)
Creatinine, Ser: 0.95 mg/dL (ref 0.44–1.00)
GFR, Estimated: >60 mL/min (ref >60)
Glucose, Bld: 127 mg/dL — ABNORMAL HIGH (ref 70–99)
Potassium: 4.1 mmol/L (ref 3.5–5.1)
Sodium: 140 mmol/L (ref 135–145)

## 2020-06-08 LAB — GLUCOSE, CAPILLARY
Glucose-Capillary: 119 mg/dL — ABNORMAL HIGH (ref 70–99)
Glucose-Capillary: 119 mg/dL — ABNORMAL HIGH (ref 70–99)
Glucose-Capillary: 114 mg/dL — ABNORMAL HIGH (ref 70–99)
Glucose-Capillary: 117 mg/dL — ABNORMAL HIGH (ref 70–99)

## 2020-06-08 MED ORDER — PROSOURCE PLUS PO LIQD
30.0000 mL | Freq: Two times a day (BID) | ORAL | Status: DC
  Administered 2020-06-09 – 2020-06-10 (×4): 30 mL via ORAL
  Filled 2020-06-08 (×4): qty 30

## 2020-06-08 MED ORDER — ADULT MULTIVITAMIN W/MINERALS CH
1.0000 | ORAL_TABLET | Freq: Every day | ORAL | Status: DC
  Administered 2020-06-08 – 2020-06-10 (×3): 1 via ORAL
  Filled 2020-06-08 (×3): qty 1

## 2020-06-08 MED ORDER — ENSURE ENLIVE PO LIQD
237.0000 mL | Freq: Two times a day (BID) | ORAL | Status: DC
  Administered 2020-06-09: 237 mL via ORAL
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
## 2020-06-08 NOTE — Care Management Important Message (Signed)
Important Message  Patient Details  Name: RADHIKA DERSHEM MRN: [id number] Date of Birth: [birth year]   Medicare Important Message Given:  Yes     Shadell Brenn [place] 06/08/2020, 2:31 PM
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
## 2020-06-08 NOTE — Care Management (Signed)
Patient has been discussed in multidisciplinary Trauma Rounds.   Nakya Weyand W. Yulisa Chirico, RN, BSN  Trauma/Neuro ICU Case Manager [phone] 

## 2020-06-09 LAB — CBC
HCT: 33 % — ABNORMAL LOW (ref 36.0–46.0)
Hemoglobin: 10.4 g/dL — ABNORMAL LOW (ref 12.0–15.0)
MCH: 29.4 pg (ref 26.0–34.0)
MCHC: 31.5 g/dL (ref 30.0–36.0)
MCV: 93.2 fL (ref 80.0–100.0)
Platelets: 161 10*3/uL (ref 150–400)
RBC: 3.54 MIL/uL — ABNORMAL LOW (ref 3.87–5.11)
RDW: 14.2 % (ref 11.5–15.5)
WBC: 5.8 10*3/uL (ref 4.0–10.5)
nRBC: 0 % (ref 0.0–0.2)

## 2020-06-09 LAB — BASIC METABOLIC PANEL
Anion gap: 8 (ref 5–15)
BUN: 8 mg/dL (ref 6–20)
CO2: 33 mmol/L — ABNORMAL HIGH (ref 22–32)
Calcium: 8.5 mg/dL — ABNORMAL LOW (ref 8.9–10.3)
Chloride: 95 mmol/L — ABNORMAL LOW (ref 98–111)
Creatinine, Ser: 0.87 mg/dL (ref 0.44–1.00)
GFR, Estimated: >60 mL/min (ref >60)
Glucose, Bld: 127 mg/dL — ABNORMAL HIGH (ref 70–99)
Potassium: 3.9 mmol/L (ref 3.5–5.1)
Sodium: 136 mmol/L (ref 135–145)

## 2020-06-09 LAB — GLUCOSE, CAPILLARY
Glucose-Capillary: 96 mg/dL (ref 70–99)
Glucose-Capillary: 120 mg/dL — ABNORMAL HIGH (ref 70–99)
Glucose-Capillary: 126 mg/dL — ABNORMAL HIGH (ref 70–99)
Glucose-Capillary: 99 mg/dL (ref 70–99)

## 2020-06-09 LAB — MAGNESIUM: Magnesium: 1.7 mg/dL (ref 1.7–2.4)

## 2020-06-09 LAB — SARS CORONAVIRUS 2 (TAT 6-24 HRS): SARS Coronavirus 2: NEGATIVE

## 2020-06-09 LAB — PHOSPHORUS: Phosphorus: 3.3 mg/dL (ref 2.5–4.6)

## 2020-06-09 MED ORDER — IPRATROPIUM-ALBUTEROL 0.5-2.5 (3) MG/3ML IN SOLN: 3.0000 mL | RESPIRATORY_TRACT | Status: DC | PRN

## 2020-06-09 MED ORDER — PROMETHAZINE HCL 25 MG/ML IJ SOLN
12.5000 mg | Freq: Three times a day (TID) | INTRAMUSCULAR | Status: AC | PRN
  Administered 2020-06-09 – 2020-06-10 (×3): 25 mg via INTRAVENOUS
  Filled 2020-06-09 (×4): qty 1
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
## 2020-06-09 NOTE — Progress Notes (Signed)
Pt falls asleep and spo2 decreases to 70%. This RN enters room and wakes pt, asking her to take a deep breath. Spo2 immediately increases and sustains at 98% while awake, but continues to drop everytime pt falls asleep. Pt awakened multiple times. No s/sx of respiratory distress evident. Pt A&O x4. o2 increased to 8 LPM (35% fio2) via face tent, spo2 increases to 92% sustained. will continue to monitor
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
## 2020-06-09 NOTE — TOC Progression Note (Addendum)
Transition of Care [facility]) - Progression Note    Patient Details  Name: Olivia Guerrero MRN: [id number] Date of Birth: 03-23-1961  Transition of Care [facility]) CM/SW Contact  Astrid Drafts Berna Spare, RN Phone Number: 06/09/2020, 3:34 PM  Clinical Narrative:  Pt has been accepted for admission to [facility] in [place], with bed available tomorrow, 2/16.  Pt agreeable to dc to this facility, as her family is nearby.  Updated pt's daughter, Judeth Cornfield, per patient's request.  Notified provider of need for COVID test.    Initiated insurance authorization with [facility]; reference number is [id number].  Faxed clinical information to [facility] at [phone].    Addendum:  1737pm Pt has been approved by insurance for admission to [facility]hab on 06/10/20.  Approval good for 3 days, starting on 06/10/20; next review on 06/12/20.  Case manager for [facility] is Bjorn Pippin.  Auth # pending.   Expected Discharge Plan: Skilled Nursing Facility Barriers to Discharge: Continued Medical Work up  Expected Discharge Plan and Services Expected Discharge Plan: Skilled Nursing Facility   Discharge Planning Services: CM Consult Post Acute Care Choice: Skilled Nursing Facility Living arrangements for the past 2 months: Mobile Home                                       Social Determinants of Health (SDOH) Interventions    Readmission Risk Interventions No flowsheet data found.  Quintella Baton, RN, BSN  Trauma/Neuro ICU Case Manager [phone]

## 2020-06-10 LAB — URINALYSIS, COMPLETE (UACMP) WITH MICROSCOPIC
Bilirubin Urine: NEGATIVE
Glucose, UA: NEGATIVE mg/dL
Hgb urine dipstick: NEGATIVE
Ketones, ur: NEGATIVE mg/dL
Nitrite: POSITIVE — AB
Protein, ur: NEGATIVE mg/dL
Specific Gravity, Urine: 1.013 (ref 1.005–1.030)
WBC, UA: >50 WBC/hpf — ABNORMAL HIGH (ref 0–5)
pH: 6 (ref 5.0–8.0)

## 2020-06-10 LAB — GLUCOSE, CAPILLARY
Glucose-Capillary: 109 mg/dL — ABNORMAL HIGH (ref 70–99)
Glucose-Capillary: 104 mg/dL — ABNORMAL HIGH (ref 70–99)

## 2020-06-10 MED ORDER — SACCHAROMYCES BOULARDII 250 MG PO CAPS: 250.0000 mg | ORAL_CAPSULE | Freq: Two times a day (BID) | ORAL | Status: AC

## 2020-06-10 MED ORDER — OXYCODONE HCL 5 MG PO TABS: 5.0000 mg | ORAL_TABLET | Freq: Four times a day (QID) | ORAL | 0 refills | Status: DC | PRN

## 2020-06-10 MED ORDER — DOCUSATE SODIUM 100 MG PO CAPS: 100.0000 mg | ORAL_CAPSULE | Freq: Every day | ORAL | 0 refills | Status: AC | PRN

## 2020-06-10 MED ORDER — BACITRACIN ZINC 500 UNIT/GM EX OINT: TOPICAL_OINTMENT | Freq: Two times a day (BID) | CUTANEOUS | 0 refills | Status: DC

## 2020-06-10 MED ORDER — WHITE PETROLATUM EX OINT
TOPICAL_OINTMENT | CUTANEOUS | Status: AC
  Filled 2020-06-10: qty 28.35

## 2020-06-10 MED ORDER — POLYETHYLENE GLYCOL 3350 17 G PO PACK: 17.0000 g | PACK | Freq: Every day | ORAL | 0 refills | Status: AC | PRN

## 2020-06-10 NOTE — Progress Notes (Signed)
Patient d/c to [facility]. All belongings packed and sent with patient. IV removed and tolerated well. All d/c paperwork and Rx placed in the packet.

## 2020-06-10 NOTE — TOC Transition Note (Signed)
Transition of Care [facility]) - CM/SW Discharge Note   Patient Details  Name: BAYLIN CABAL MRN: [id number] Date of Birth: [birth year]  Transition of Care [facility]) CM/SW Contact:  Glennon Mac, RN Phone Number: 06/10/2020, 12:30  Clinical Narrative:   Pt medically stable for discharge to [facility] Re[facility].  Pt going to room 144 at facility; bedside nurse to call report to [phone].  Discharge summary faxed to facility.  [facility] Transportation coordinator has arranged [facility] transport for pickup "soon".     Final next level of care: Skilled Nursing Facility Barriers to Discharge: Barriers Resolved   Patient Goals and CMS Choice Patient states their goals for this hospitalization and ongoing recovery are:: to get stronger CMS Medicare.gov Compare Post Acute Care list provided to:: Patient Choice offered to / list presented to : Patient  Discharge Placement PASRR number recieved: 06/03/20            Patient chooses bed at: [facility] Patient to be transferred to facility by: [facility] Name of family member notified: daughter, Judeth Cornfield (patient states she will contact her) Patient and family notified of of transfer: 06/10/20  Discharge Plan and Services   Discharge Planning Services: CM Consult Post Acute Care Choice: Skilled Nursing Facility                               Social Determinants of Health (SDOH) Interventions     Readmission Risk Interventions No flowsheet data found.  Quintella Baton, RN, BSN  Trauma/Neuro ICU Case Manager [phone]

## 2020-06-12 LAB — URINE CULTURE: Culture: >=100000 — AB

## 2020-06-25 ENCOUNTER — Other Ambulatory Visit: Payer: Self-pay

## 2020-06-25 ENCOUNTER — Inpatient Hospital Stay (HOSPITAL_COMMUNITY)
Admission: EM | Admit: 2020-06-25 | Discharge: 2020-07-02 | DRG: 683 | Disposition: A | Discharge status: Home / Self Care | Payer: Medicare Other | Attending: Internal Medicine | Admitting: Internal Medicine

## 2020-06-25 ENCOUNTER — Encounter (HOSPITAL_COMMUNITY): Payer: Self-pay

## 2020-06-25 DIAGNOSIS — I10 Essential (primary) hypertension: Secondary | ICD-10-CM | POA: Diagnosis present

## 2020-06-25 DIAGNOSIS — E119 Type 2 diabetes mellitus without complications: Secondary | ICD-10-CM | POA: Diagnosis present

## 2020-06-25 DIAGNOSIS — Z7951 Long term (current) use of inhaled steroids: Secondary | ICD-10-CM

## 2020-06-25 DIAGNOSIS — J449 Chronic obstructive pulmonary disease, unspecified: Secondary | ICD-10-CM | POA: Diagnosis present

## 2020-06-25 DIAGNOSIS — D649 Anemia, unspecified: Secondary | ICD-10-CM | POA: Diagnosis present

## 2020-06-25 DIAGNOSIS — Z9981 Dependence on supplemental oxygen: Secondary | ICD-10-CM

## 2020-06-25 DIAGNOSIS — R7989 Other specified abnormal findings of blood chemistry: Secondary | ICD-10-CM | POA: Diagnosis not present

## 2020-06-25 DIAGNOSIS — Z91041 Radiographic dye allergy status: Secondary | ICD-10-CM

## 2020-06-25 DIAGNOSIS — Z91013 Allergy to seafood: Secondary | ICD-10-CM

## 2020-06-25 DIAGNOSIS — E1169 Type 2 diabetes mellitus with other specified complication: Secondary | ICD-10-CM | POA: Diagnosis present

## 2020-06-25 DIAGNOSIS — E860 Dehydration: Secondary | ICD-10-CM | POA: Diagnosis present

## 2020-06-25 DIAGNOSIS — N179 Acute kidney failure, unspecified: Principal | ICD-10-CM | POA: Diagnosis present

## 2020-06-25 DIAGNOSIS — Z87891 Personal history of nicotine dependence: Secondary | ICD-10-CM

## 2020-06-25 DIAGNOSIS — I110 Hypertensive heart disease with heart failure: Secondary | ICD-10-CM | POA: Diagnosis present

## 2020-06-25 DIAGNOSIS — Z6841 Body Mass Index (BMI) 40.0 and over, adult: Secondary | ICD-10-CM

## 2020-06-25 DIAGNOSIS — N261 Atrophy of kidney (terminal): Secondary | ICD-10-CM | POA: Diagnosis present

## 2020-06-25 DIAGNOSIS — E875 Hyperkalemia: Secondary | ICD-10-CM | POA: Diagnosis present

## 2020-06-25 DIAGNOSIS — Z91040 Latex allergy status: Secondary | ICD-10-CM

## 2020-06-25 DIAGNOSIS — Z79899 Other long term (current) drug therapy: Secondary | ICD-10-CM

## 2020-06-25 DIAGNOSIS — E785 Hyperlipidemia, unspecified: Secondary | ICD-10-CM

## 2020-06-25 DIAGNOSIS — Z885 Allergy status to narcotic agent status: Secondary | ICD-10-CM

## 2020-06-25 DIAGNOSIS — I959 Hypotension, unspecified: Secondary | ICD-10-CM | POA: Diagnosis present

## 2020-06-25 DIAGNOSIS — I5032 Chronic diastolic (congestive) heart failure: Secondary | ICD-10-CM

## 2020-06-25 DIAGNOSIS — Z20822 Contact with and (suspected) exposure to covid-19: Secondary | ICD-10-CM | POA: Diagnosis present

## 2020-06-25 DIAGNOSIS — J9611 Chronic respiratory failure with hypoxia: Secondary | ICD-10-CM

## 2020-06-25 DIAGNOSIS — Z90710 Acquired absence of both cervix and uterus: Secondary | ICD-10-CM

## 2020-06-25 DIAGNOSIS — E669 Obesity, unspecified: Secondary | ICD-10-CM | POA: Diagnosis present

## 2020-06-25 LAB — BASIC METABOLIC PANEL
Anion gap: 11 (ref 5–15)
BUN: 62 mg/dL — ABNORMAL HIGH (ref 6–20)
CO2: 27 mmol/L (ref 22–32)
Calcium: 8.6 mg/dL — ABNORMAL LOW (ref 8.9–10.3)
Chloride: 100 mmol/L (ref 98–111)
Creatinine, Ser: 3.56 mg/dL — ABNORMAL HIGH (ref 0.44–1.00)
GFR, Estimated: 14 mL/min — ABNORMAL LOW (ref >60)
Glucose, Bld: 106 mg/dL — ABNORMAL HIGH (ref 70–99)
Potassium: 6 mmol/L — ABNORMAL HIGH (ref 3.5–5.1)
Sodium: 138 mmol/L (ref 135–145)

## 2020-06-25 LAB — CBC WITH DIFFERENTIAL/PLATELET
Abs Immature Granulocytes: 0.02 10*3/uL (ref 0.00–0.07)
Basophils Absolute: 0.1 10*3/uL (ref 0.0–0.1)
Basophils Relative: 1 %
Eosinophils Absolute: 0.2 10*3/uL (ref 0.0–0.5)
Eosinophils Relative: 2 %
HCT: 36.1 % (ref 36.0–46.0)
Hemoglobin: 10.5 g/dL — ABNORMAL LOW (ref 12.0–15.0)
Immature Granulocytes: 0 %
Lymphocytes Relative: 23 %
Lymphs Abs: 1.7 10*3/uL (ref 0.7–4.0)
MCH: 28.4 pg (ref 26.0–34.0)
MCHC: 29.1 g/dL — ABNORMAL LOW (ref 30.0–36.0)
MCV: 97.6 fL (ref 80.0–100.0)
Monocytes Absolute: 0.6 10*3/uL (ref 0.1–1.0)
Monocytes Relative: 9 %
Neutro Abs: 4.7 10*3/uL (ref 1.7–7.7)
Neutrophils Relative %: 65 %
Platelets: 159 10*3/uL (ref 150–400)
RBC: 3.7 MIL/uL — ABNORMAL LOW (ref 3.87–5.11)
RDW: 15.8 % — ABNORMAL HIGH (ref 11.5–15.5)
WBC: 7.2 10*3/uL (ref 4.0–10.5)
nRBC: 0 % (ref 0.0–0.2)

## 2020-06-25 LAB — LACTIC ACID, PLASMA: Lactic Acid, Venous: 0.9 mmol/L (ref 0.5–1.9)

## 2020-06-25 MED ORDER — SODIUM ZIRCONIUM CYCLOSILICATE 10 G PO PACK
10.0000 g | PACK | Freq: Once | ORAL | Status: AC
  Administered 2020-06-25: 10 g via ORAL
  Filled 2020-06-25: qty 1

## 2020-06-25 MED ORDER — SODIUM CHLORIDE 0.9 % IV BOLUS
500.0000 mL | Freq: Once | INTRAVENOUS | Status: AC
  Administered 2020-06-25: 500 mL via INTRAVENOUS

## 2020-06-25 NOTE — ED Triage Notes (Signed)
Brought in by [facility] from [place] - c/o abnormal BUN and CREA and fatigue. Chronic on oxygen at 5lpm via fm.  Per EMS (facility is not making pt walk due to falls risk)
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
## 2020-06-26 ENCOUNTER — Encounter (HOSPITAL_COMMUNITY): Payer: Self-pay | Admitting: Internal Medicine

## 2020-06-26 ENCOUNTER — Observation Stay (HOSPITAL_COMMUNITY): Payer: Medicare Other

## 2020-06-26 DIAGNOSIS — Z9981 Dependence on supplemental oxygen: Secondary | ICD-10-CM | POA: Diagnosis not present

## 2020-06-26 DIAGNOSIS — Z87891 Personal history of nicotine dependence: Secondary | ICD-10-CM | POA: Diagnosis not present

## 2020-06-26 DIAGNOSIS — N261 Atrophy of kidney (terminal): Secondary | ICD-10-CM | POA: Diagnosis present

## 2020-06-26 DIAGNOSIS — E875 Hyperkalemia: Secondary | ICD-10-CM | POA: Diagnosis present

## 2020-06-26 DIAGNOSIS — N179 Acute kidney failure, unspecified: Secondary | ICD-10-CM | POA: Diagnosis present

## 2020-06-26 DIAGNOSIS — I10 Essential (primary) hypertension: Secondary | ICD-10-CM | POA: Diagnosis present

## 2020-06-26 DIAGNOSIS — J9611 Chronic respiratory failure with hypoxia: Secondary | ICD-10-CM | POA: Diagnosis present

## 2020-06-26 DIAGNOSIS — I110 Hypertensive heart disease with heart failure: Secondary | ICD-10-CM | POA: Diagnosis present

## 2020-06-26 DIAGNOSIS — I5032 Chronic diastolic (congestive) heart failure: Secondary | ICD-10-CM | POA: Diagnosis present

## 2020-06-26 DIAGNOSIS — J449 Chronic obstructive pulmonary disease, unspecified: Secondary | ICD-10-CM | POA: Diagnosis present

## 2020-06-26 DIAGNOSIS — R7989 Other specified abnormal findings of blood chemistry: Secondary | ICD-10-CM | POA: Diagnosis present

## 2020-06-26 DIAGNOSIS — Z79899 Other long term (current) drug therapy: Secondary | ICD-10-CM | POA: Diagnosis not present

## 2020-06-26 DIAGNOSIS — Z7951 Long term (current) use of inhaled steroids: Secondary | ICD-10-CM | POA: Diagnosis not present

## 2020-06-26 DIAGNOSIS — E1169 Type 2 diabetes mellitus with other specified complication: Secondary | ICD-10-CM | POA: Diagnosis not present

## 2020-06-26 DIAGNOSIS — Z885 Allergy status to narcotic agent status: Secondary | ICD-10-CM | POA: Diagnosis not present

## 2020-06-26 DIAGNOSIS — E669 Obesity, unspecified: Secondary | ICD-10-CM | POA: Diagnosis not present

## 2020-06-26 DIAGNOSIS — Z91040 Latex allergy status: Secondary | ICD-10-CM | POA: Diagnosis not present

## 2020-06-26 DIAGNOSIS — E119 Type 2 diabetes mellitus without complications: Secondary | ICD-10-CM | POA: Diagnosis present

## 2020-06-26 DIAGNOSIS — D649 Anemia, unspecified: Secondary | ICD-10-CM | POA: Diagnosis present

## 2020-06-26 DIAGNOSIS — I959 Hypotension, unspecified: Secondary | ICD-10-CM | POA: Diagnosis present

## 2020-06-26 DIAGNOSIS — Z20822 Contact with and (suspected) exposure to covid-19: Secondary | ICD-10-CM | POA: Diagnosis present

## 2020-06-26 DIAGNOSIS — Z6841 Body Mass Index (BMI) 40.0 and over, adult: Secondary | ICD-10-CM | POA: Diagnosis not present

## 2020-06-26 DIAGNOSIS — Z91013 Allergy to seafood: Secondary | ICD-10-CM | POA: Diagnosis not present

## 2020-06-26 DIAGNOSIS — Z90710 Acquired absence of both cervix and uterus: Secondary | ICD-10-CM | POA: Diagnosis not present

## 2020-06-26 DIAGNOSIS — Z91041 Radiographic dye allergy status: Secondary | ICD-10-CM | POA: Diagnosis not present

## 2020-06-26 DIAGNOSIS — E860 Dehydration: Secondary | ICD-10-CM | POA: Diagnosis present

## 2020-06-26 DIAGNOSIS — E785 Hyperlipidemia, unspecified: Secondary | ICD-10-CM | POA: Diagnosis present

## 2020-06-26 LAB — COMPREHENSIVE METABOLIC PANEL
ALT: 17 U/L (ref 0–44)
AST: 14 U/L — ABNORMAL LOW (ref 15–41)
Albumin: 2.9 g/dL — ABNORMAL LOW (ref 3.5–5.0)
Alkaline Phosphatase: 75 U/L (ref 38–126)
Anion gap: 9 (ref 5–15)
BUN: 57 mg/dL — ABNORMAL HIGH (ref 6–20)
CO2: 27 mmol/L (ref 22–32)
Calcium: 8.4 mg/dL — ABNORMAL LOW (ref 8.9–10.3)
Chloride: 104 mmol/L (ref 98–111)
Creatinine, Ser: 3.39 mg/dL — ABNORMAL HIGH (ref 0.44–1.00)
GFR, Estimated: 15 mL/min — ABNORMAL LOW (ref >60)
Glucose, Bld: 101 mg/dL — ABNORMAL HIGH (ref 70–99)
Potassium: 5.3 mmol/L — ABNORMAL HIGH (ref 3.5–5.1)
Sodium: 140 mmol/L (ref 135–145)
Total Bilirubin: 0.7 mg/dL (ref 0.3–1.2)
Total Protein: 6.2 g/dL — ABNORMAL LOW (ref 6.5–8.1)

## 2020-06-26 LAB — CBC
HCT: 35.5 % — ABNORMAL LOW (ref 36.0–46.0)
Hemoglobin: 10 g/dL — ABNORMAL LOW (ref 12.0–15.0)
MCH: 28.2 pg (ref 26.0–34.0)
MCHC: 28.2 g/dL — ABNORMAL LOW (ref 30.0–36.0)
MCV: 100.3 fL — ABNORMAL HIGH (ref 80.0–100.0)
Platelets: 165 10*3/uL (ref 150–400)
RBC: 3.54 MIL/uL — ABNORMAL LOW (ref 3.87–5.11)
RDW: 14.9 % (ref 11.5–15.5)
WBC: 7.8 10*3/uL (ref 4.0–10.5)
nRBC: 0 % (ref 0.0–0.2)

## 2020-06-26 LAB — URINALYSIS, ROUTINE W REFLEX MICROSCOPIC
Bilirubin Urine: NEGATIVE
Glucose, UA: NEGATIVE mg/dL
Ketones, ur: NEGATIVE mg/dL
Nitrite: NEGATIVE
Protein, ur: NEGATIVE mg/dL
Specific Gravity, Urine: 1.012 (ref 1.005–1.030)
pH: 5 (ref 5.0–8.0)

## 2020-06-26 LAB — HIV ANTIBODY (ROUTINE TESTING W REFLEX): HIV Screen 4th Generation wRfx: NONREACTIVE

## 2020-06-26 LAB — GLUCOSE, CAPILLARY
Glucose-Capillary: 94 mg/dL (ref 70–99)
Glucose-Capillary: 110 mg/dL — ABNORMAL HIGH (ref 70–99)

## 2020-06-26 LAB — CBG MONITORING, ED
Glucose-Capillary: 107 mg/dL — ABNORMAL HIGH (ref 70–99)
Glucose-Capillary: 96 mg/dL (ref 70–99)

## 2020-06-26 LAB — CK: Total CK: 78 U/L (ref 38–234)

## 2020-06-26 LAB — SARS CORONAVIRUS 2 (TAT 6-24 HRS): SARS Coronavirus 2: NEGATIVE

## 2020-06-26 MED ORDER — HEPARIN SODIUM (PORCINE) 5000 UNIT/ML IJ SOLN
5000.0000 [IU] | Freq: Three times a day (TID) | INTRAMUSCULAR | Status: DC
  Administered 2020-06-26 – 2020-07-02 (×19): 5000 [IU] via SUBCUTANEOUS
  Filled 2020-06-26 (×19): qty 1

## 2020-06-26 MED ORDER — MIRABEGRON ER 25 MG PO TB24
50.0000 mg | ORAL_TABLET | Freq: Every day | ORAL | Status: DC
  Administered 2020-06-26 – 2020-07-02 (×7): 50 mg via ORAL
  Filled 2020-06-26: qty 2
  Filled 2020-06-26: qty 1
  Filled 2020-06-26 (×5): qty 2

## 2020-06-26 MED ORDER — FLUTICASONE-UMECLIDIN-VILANT 100-62.5-25 MCG/INH IN AEPB: 1.0000 | INHALATION_SPRAY | Freq: Every day | RESPIRATORY_TRACT | Status: DC

## 2020-06-26 MED ORDER — ALBUTEROL SULFATE HFA 108 (90 BASE) MCG/ACT IN AERS: 2.0000 | INHALATION_SPRAY | RESPIRATORY_TRACT | Status: DC | PRN

## 2020-06-26 MED ORDER — FLUTICASONE FUROATE-VILANTEROL 100-25 MCG/INH IN AEPB
1.0000 | INHALATION_SPRAY | Freq: Every day | RESPIRATORY_TRACT | Status: DC
  Administered 2020-06-27 – 2020-07-02 (×6): 1 via RESPIRATORY_TRACT
  Filled 2020-06-26: qty 28

## 2020-06-26 MED ORDER — SODIUM CHLORIDE 0.9 % IV SOLN: INTRAVENOUS | Status: AC

## 2020-06-26 MED ORDER — GABAPENTIN 300 MG PO CAPS
300.0000 mg | ORAL_CAPSULE | Freq: Every day | ORAL | Status: DC
  Administered 2020-06-26 – 2020-07-02 (×7): 300 mg via ORAL
  Filled 2020-06-26 (×8): qty 1

## 2020-06-26 MED ORDER — HYDROXYZINE PAMOATE 25 MG PO CAPS: 25.0000 mg | ORAL_CAPSULE | Freq: Three times a day (TID) | ORAL | Status: DC | PRN

## 2020-06-26 MED ORDER — INSULIN ASPART 100 UNIT/ML ~~LOC~~ SOLN
0.0000 [IU] | Freq: Three times a day (TID) | SUBCUTANEOUS | Status: DC
  Administered 2020-06-28: 1 [IU] via SUBCUTANEOUS

## 2020-06-26 MED ORDER — UMECLIDINIUM BROMIDE 62.5 MCG/INH IN AEPB
1.0000 | INHALATION_SPRAY | Freq: Every day | RESPIRATORY_TRACT | Status: DC
  Administered 2020-06-27 – 2020-07-02 (×6): 1 via RESPIRATORY_TRACT
  Filled 2020-06-26: qty 7

## 2020-06-26 MED ORDER — SACCHAROMYCES BOULARDII 250 MG PO CAPS
250.0000 mg | ORAL_CAPSULE | Freq: Two times a day (BID) | ORAL | Status: DC
  Administered 2020-06-26 – 2020-07-02 (×13): 250 mg via ORAL
  Filled 2020-06-26 (×14): qty 1

## 2020-06-26 MED ORDER — HYDROXYZINE HCL 25 MG PO TABS
25.0000 mg | ORAL_TABLET | Freq: Three times a day (TID) | ORAL | Status: DC | PRN
  Administered 2020-06-29 – 2020-07-02 (×12): 25 mg via ORAL
  Filled 2020-06-26 (×12): qty 1

## 2020-06-26 MED ORDER — SODIUM CHLORIDE 0.9 % IV BOLUS
1000.0000 mL | Freq: Once | INTRAVENOUS | Status: AC
  Administered 2020-06-26: 1000 mL via INTRAVENOUS

## 2020-06-26 MED ORDER — DOXEPIN HCL 10 MG PO CAPS
10.0000 mg | ORAL_CAPSULE | Freq: Every day | ORAL | Status: DC
  Administered 2020-06-26 – 2020-07-01 (×7): 10 mg via ORAL
  Filled 2020-06-26 (×9): qty 1

## 2020-06-26 MED ORDER — POLYETHYLENE GLYCOL 3350 17 G PO PACK: 17.0000 g | PACK | Freq: Every day | ORAL | Status: DC | PRN

## 2020-06-26 MED ORDER — DOCUSATE SODIUM 100 MG PO CAPS: 100.0000 mg | ORAL_CAPSULE | Freq: Every day | ORAL | Status: DC | PRN

## 2020-06-26 MED ORDER — ATORVASTATIN CALCIUM 10 MG PO TABS
10.0000 mg | ORAL_TABLET | Freq: Every day | ORAL | Status: DC
  Administered 2020-06-26 – 2020-07-01 (×7): 10 mg via ORAL
  Filled 2020-06-26 (×7): qty 1

## 2020-06-26 NOTE — Progress Notes (Signed)
PT Cancellation Note  Patient Details Name: Olivia Guerrero MRN: [id number] DOB: 1960/10/26   Cancelled Treatment:    Reason Eval/Treat Not Completed: Other (comment).  Two attempts to see pt have been made, waiting for her regular bed.  Follow as time and pt allow.   Ivar Drape 06/26/2020, 2:36 PM Samul Dada, PT MS Acute Rehab Dept. Number: [facility] [id number] and [facility] [phone]

## 2020-06-26 NOTE — ED Notes (Signed)
PT arrived at bedside. 
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
## 2020-06-26 NOTE — ED Notes (Signed)
Request NS to follow up on bariatric bed.

## 2020-06-26 NOTE — ED Notes (Signed)
Report attempted 
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
## 2020-06-26 NOTE — Progress Notes (Signed)
PT Cancellation Note  Patient Details Name: Olivia Guerrero MRN: [id number] DOB: [birth year]   Cancelled Treatment:    Reason Eval/Treat Not Completed: Other (comment).  Pt will need a standard bed height to attempt standing given her body habitus.  Pt is not tall enough to sit down on gurney if standing is not supported.  Will wait for the ordered bed to eval her.   Ivar Drape 06/26/2020, 11:39 AM   Samul Dada, PT MS Acute Rehab Dept. Number: [facility] [id number] and [facility] [phone]

## 2020-06-26 NOTE — ED Notes (Signed)
Patient transported to CT 

## 2020-06-26 NOTE — ED Notes (Signed)
Report attempted again. 

## 2020-06-26 NOTE — ED Notes (Signed)
Lunch Tray Ordered @ 1034. 

## 2020-06-27 LAB — BASIC METABOLIC PANEL
Anion gap: 6 (ref 5–15)
BUN: 37 mg/dL — ABNORMAL HIGH (ref 6–20)
CO2: 28 mmol/L (ref 22–32)
Calcium: 8.7 mg/dL — ABNORMAL LOW (ref 8.9–10.3)
Chloride: 107 mmol/L (ref 98–111)
Creatinine, Ser: 1.55 mg/dL — ABNORMAL HIGH (ref 0.44–1.00)
GFR, Estimated: 38 mL/min — ABNORMAL LOW (ref >60)
Glucose, Bld: 95 mg/dL (ref 70–99)
Potassium: 5.6 mmol/L — ABNORMAL HIGH (ref 3.5–5.1)
Sodium: 141 mmol/L (ref 135–145)

## 2020-06-27 LAB — MAGNESIUM: Magnesium: 2.2 mg/dL (ref 1.7–2.4)

## 2020-06-27 LAB — GLUCOSE, CAPILLARY
Glucose-Capillary: 89 mg/dL (ref 70–99)
Glucose-Capillary: 95 mg/dL (ref 70–99)
Glucose-Capillary: 96 mg/dL (ref 70–99)
Glucose-Capillary: 109 mg/dL — ABNORMAL HIGH (ref 70–99)

## 2020-06-27 MED ORDER — SODIUM CHLORIDE 0.9 % IV SOLN: INTRAVENOUS | Status: DC

## 2020-06-27 MED ORDER — SODIUM ZIRCONIUM CYCLOSILICATE 10 G PO PACK
10.0000 g | PACK | Freq: Two times a day (BID) | ORAL | Status: AC
  Administered 2020-06-27 (×2): 10 g via ORAL
  Filled 2020-06-27 (×2): qty 1
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
## 2020-06-28 LAB — URINE CULTURE: Culture: 20000 — AB

## 2020-06-28 LAB — BASIC METABOLIC PANEL
Anion gap: 8 (ref 5–15)
BUN: 19 mg/dL (ref 6–20)
CO2: 29 mmol/L (ref 22–32)
Calcium: 8.9 mg/dL (ref 8.9–10.3)
Chloride: 104 mmol/L (ref 98–111)
Creatinine, Ser: 1.04 mg/dL — ABNORMAL HIGH (ref 0.44–1.00)
GFR, Estimated: >60 mL/min (ref >60)
Glucose, Bld: 89 mg/dL (ref 70–99)
Potassium: 5.2 mmol/L — ABNORMAL HIGH (ref 3.5–5.1)
Sodium: 141 mmol/L (ref 135–145)

## 2020-06-28 LAB — SARS CORONAVIRUS 2 (TAT 6-24 HRS): SARS Coronavirus 2: NEGATIVE

## 2020-06-28 LAB — GLUCOSE, CAPILLARY
Glucose-Capillary: 98 mg/dL (ref 70–99)
Glucose-Capillary: 97 mg/dL (ref 70–99)
Glucose-Capillary: 123 mg/dL — ABNORMAL HIGH (ref 70–99)
Glucose-Capillary: 95 mg/dL (ref 70–99)

## 2020-06-28 MED ORDER — FLUCONAZOLE 150 MG PO TABS
150.0000 mg | ORAL_TABLET | Freq: Once | ORAL | Status: AC
  Administered 2020-06-28: 150 mg via ORAL
  Filled 2020-06-28: qty 1

## 2020-06-28 MED ORDER — SODIUM ZIRCONIUM CYCLOSILICATE 10 G PO PACK
10.0000 g | PACK | Freq: Two times a day (BID) | ORAL | Status: AC
  Administered 2020-06-28 (×2): 10 g via ORAL
  Filled 2020-06-28 (×2): qty 1

## 2020-06-28 MED ORDER — PROMETHAZINE HCL 25 MG/ML IJ SOLN
12.5000 mg | Freq: Four times a day (QID) | INTRAMUSCULAR | Status: DC | PRN
  Administered 2020-06-28 – 2020-07-02 (×13): 12.5 mg via INTRAVENOUS
  Filled 2020-06-28 (×16): qty 1

## 2020-06-28 MED ORDER — ONDANSETRON HCL 4 MG/2ML IJ SOLN
4.0000 mg | Freq: Once | INTRAMUSCULAR | Status: AC | PRN
  Administered 2020-06-28: 4 mg via INTRAVENOUS
  Filled 2020-06-28: qty 2
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
## 2020-06-28 NOTE — Progress Notes (Signed)
OT Cancellation Note  Patient Details Name: Olivia Guerrero MRN: [id number] DOB: 06-07-1960   Cancelled Treatment:    Reason Eval/Treat Not Completed: Other (comment). Pt requesting OT reattempt at a later time. Pt lethargic, eyes mostly closed while talking. She endorses having a rough night, currently resting in bed. Plan to reattempt.   Raynald Kemp, OT Acute Rehabilitation Services Pager: [phone] Office: [phone]  06/28/2020, 9:46 AM
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
## 2020-06-29 LAB — BASIC METABOLIC PANEL
Anion gap: 9 (ref 5–15)
BUN: 10 mg/dL (ref 6–20)
CO2: 28 mmol/L (ref 22–32)
Calcium: 9.2 mg/dL (ref 8.9–10.3)
Chloride: 102 mmol/L (ref 98–111)
Creatinine, Ser: 0.98 mg/dL (ref 0.44–1.00)
GFR, Estimated: >60 mL/min (ref >60)
Glucose, Bld: 107 mg/dL — ABNORMAL HIGH (ref 70–99)
Potassium: 4.7 mmol/L (ref 3.5–5.1)
Sodium: 139 mmol/L (ref 135–145)

## 2020-06-29 LAB — GLUCOSE, CAPILLARY
Glucose-Capillary: 105 mg/dL — ABNORMAL HIGH (ref 70–99)
Glucose-Capillary: 98 mg/dL (ref 70–99)
Glucose-Capillary: 97 mg/dL (ref 70–99)
Glucose-Capillary: 135 mg/dL — ABNORMAL HIGH (ref 70–99)

## 2020-06-29 MED ORDER — OXYCODONE HCL 5 MG PO TABS: 5.0000 mg | ORAL_TABLET | Freq: Four times a day (QID) | ORAL | 0 refills | Status: DC | PRN

## 2020-06-29 MED ORDER — PROMETHAZINE HCL 25 MG PO TABS: 25.0000 mg | ORAL_TABLET | Freq: Four times a day (QID) | ORAL | 0 refills | Status: AC | PRN
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
## 2020-06-29 NOTE — TOC Progression Note (Addendum)
Transition of Care [facility]) - Progression Note    Patient Details  Name: Olivia Guerrero MRN: [id number] Date of Birth: 11-25-1960  [facility] CM/SW Contact  Beckie Busing, RN Phone Number: [phone]  06/29/2020, 9:53 AM  Clinical Narrative:    CM spoke with Irving Burton at [facility] , Per [place] facility will have to have patient reinstated with insurance auth before patient can return to the facility. CM has faxed H&P, current notes, therapy notes and current FL2 as requested.  1330 CM Just received call from [facility] stating that [employer] is requesting a peer to peer before they will approve patient going back to the facility. Contact info [phone] option 5. Deadline 0900 tomorrow am. Message has been sent to Dr. [place].  1410 MD updated CM on peer to peer. Patient has been denied for SNF, only approved for long term care. CM attempted to call [facility] to determine if long term bed is available. No answer will reach out again shortly.   1530 CM spoke with Irving Burton at [facility]. Per Irving Burton the facility does have long term beds but the patient does not have a payor source for long term care. The facility is willing to work with patient and family to determine eligibility and financial responsibility.  1600 Patient has been updated and appeal number has been provided.    Expected Discharge Plan: Skilled Nursing Facility Barriers to Discharge: Continued Medical Work up,SNF Pending discharge summary  Expected Discharge Plan and Services Expected Discharge Plan: Skilled Nursing Facility     Post Acute Care Choice: Skilled Nursing Facility Living arrangements for the past 2 months: Mobile Home,Skilled Nursing Facility Expected Discharge Date: 06/29/20                                     Social Determinants of Health (SDOH) Interventions    Readmission Risk Interventions No flowsheet data found.
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
## 2020-06-30 LAB — GLUCOSE, CAPILLARY
Glucose-Capillary: 112 mg/dL — ABNORMAL HIGH (ref 70–99)
Glucose-Capillary: 127 mg/dL — ABNORMAL HIGH (ref 70–99)
Glucose-Capillary: 100 mg/dL — ABNORMAL HIGH (ref 70–99)
Glucose-Capillary: 109 mg/dL — ABNORMAL HIGH (ref 70–99)

## 2020-06-30 MED ORDER — PROCHLORPERAZINE EDISYLATE 10 MG/2ML IJ SOLN
10.0000 mg | Freq: Four times a day (QID) | INTRAMUSCULAR | Status: DC | PRN
  Administered 2020-06-30 – 2020-07-02 (×6): 10 mg via INTRAVENOUS
  Filled 2020-06-30 (×7): qty 2

## 2020-06-30 NOTE — Care Management Important Message (Signed)
Important Message  Patient Details  Name: Olivia Guerrero MRN: [id number] Date of Birth: 01/13/1961   Medicare Important Message Given:  Yes     Dorena Bodo 06/30/2020, 3:24 PM

## 2020-06-30 NOTE — TOC Progression Note (Signed)
Transition of Care [facility]) - Progression Note    Patient Details  Name: Olivia Guerrero MRN: [id number] Date of Birth: 1961-03-26  Transition of [facility] CM/SW Contact  Beckie Busing, RN Phone Number: [phone]  06/30/2020, 11:01 AM  Clinical Narrative:    CM at bedside to speak with patient about d/c plan. Patient reports that she has filed an Therapist, nutritional. Therapist, nutritional in process. Irving Burton with [facility] has been made aware. TOC will continue to follow.    Expected Discharge Plan: Skilled Nursing Facility Barriers to Discharge: Continued Medical Work up,SNF Pending discharge summary  Expected Discharge Plan and Services Expected Discharge Plan: Skilled Nursing Facility     Post Acute Care Choice: Skilled Nursing Facility Living arrangements for the past 2 months: Mobile Home,Skilled Nursing Facility Expected Discharge Date: 06/29/20                                     Social Determinants of Health (SDOH) Interventions    Readmission Risk Interventions No flowsheet data found.
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
## 2020-07-01 LAB — GLUCOSE, CAPILLARY
Glucose-Capillary: 116 mg/dL — ABNORMAL HIGH (ref 70–99)
Glucose-Capillary: 120 mg/dL — ABNORMAL HIGH (ref 70–99)
Glucose-Capillary: 92 mg/dL (ref 70–99)
Glucose-Capillary: 110 mg/dL — ABNORMAL HIGH (ref 70–99)

## 2020-07-01 MED ORDER — MELATONIN 5 MG PO TABS
10.0000 mg | ORAL_TABLET | Freq: Every evening | ORAL | Status: DC | PRN
  Administered 2020-07-01: 10 mg via ORAL
  Filled 2020-07-01: qty 2
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
## 2020-07-02 DIAGNOSIS — E785 Hyperlipidemia, unspecified: Secondary | ICD-10-CM

## 2020-07-02 DIAGNOSIS — I5032 Chronic diastolic (congestive) heart failure: Secondary | ICD-10-CM

## 2020-07-02 DIAGNOSIS — J9611 Chronic respiratory failure with hypoxia: Secondary | ICD-10-CM

## 2020-07-02 LAB — GLUCOSE, CAPILLARY
Glucose-Capillary: 107 mg/dL — ABNORMAL HIGH (ref 70–99)
Glucose-Capillary: 112 mg/dL — ABNORMAL HIGH (ref 70–99)

## 2020-07-02 NOTE — TOC Transition Note (Signed)
Transition of Care [facility]) - CM/SW Discharge Note   Patient Details  Name: Olivia Guerrero MRN: [id number] Date of Birth: 10-12-1960  [facility] CM/SW Contact:  Beckie Busing, RN Phone Number: [phone]  07/02/2020, 3:36 PM   Clinical Narrative:    CM at bedside to update patient on insurance appeal and patient states " I want to go home today. I am tired of waiting." MD made aware of patients request and is agreeable to discharge patient. Patient refuses home health PT stating I can do that on my own. Patient is agreeable to rolling walker. Dan Humphreys has been ordered per [employer] and will be delivered tot the room. Patient states she is able to manage at home because her husband has moved back in and she has a neighbor that is willing to check on her. Patient reports that she has home O2 and family will bring portable tank when they come to pick her up. Bedside nurse has been updated. TOC will sign off.    Final next level of care: Home/Self Care Barriers to Discharge: No Barriers Identified   Patient Goals and CMS Choice Patient states their goals for this hospitalization and ongoing recovery are:: Patient wants to go home today CMS Medicare.gov Compare Post Acute Care list provided to:: Patient Choice offered to / list presented to : Patient  Discharge Placement                       Discharge Plan and Services     Post Acute Care Choice: Skilled Nursing Facility          DME Arranged: Dan Humphreys rolling DME Agency: [employer] Date DME Agency Contacted: 07/02/20 Time DME Agency Contacted: 1535 Representative spoke with at DME Agency: Silvio Pate HH Arranged: NA (patient refused) HH Agency: NA (patient refused)        Social Determinants of Health (SDOH) Interventions     Readmission Risk Interventions No flowsheet data found.
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
## 2020-07-02 NOTE — TOC Progression Note (Signed)
Transition of Care [facility]) - Progression Note    Patient Details  Name: Olivia Guerrero MRN: [id number] Date of Birth: 03-02-1961  Transition of Care [facility] CM/SW Contact  Beckie Busing, RN Phone Number: [phone]  07/02/2020, 2:24 PM  Clinical Narrative:    CM called [employer] to determine where we are in the appeal process. The appeal is currently in progress. No determination yet. Health Plan appeal package was completed on 07/01/20 @0920 . CM advised to call back tomorrow 3/11 after lunch time. TOC will continue to follow.   Expected Discharge Plan: Skilled Nursing Facility Barriers to Discharge: Continued Medical Work up,SNF Pending discharge summary  Expected Discharge Plan and Services Expected Discharge Plan: Skilled Nursing Facility     Post Acute Care Choice: Skilled Nursing Facility Living arrangements for the past 2 months: Mobile Home,Skilled Nursing Facility Expected Discharge Date: 06/29/20                                     Social Determinants of Health (SDOH) Interventions    Readmission Risk Interventions No flowsheet data found.
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
## 2022-03-25 DEATH — deceased

## 2022-09-28 IMAGING — DX DG CHEST 1V PORT
1 series · 1 of 1 positions shown · non-contrast
Comparison: 06/01/2020

CLINICAL DATA: Ventilator dependence.

EXAM:
PORTABLE CHEST 1 VIEW

[chest ap]
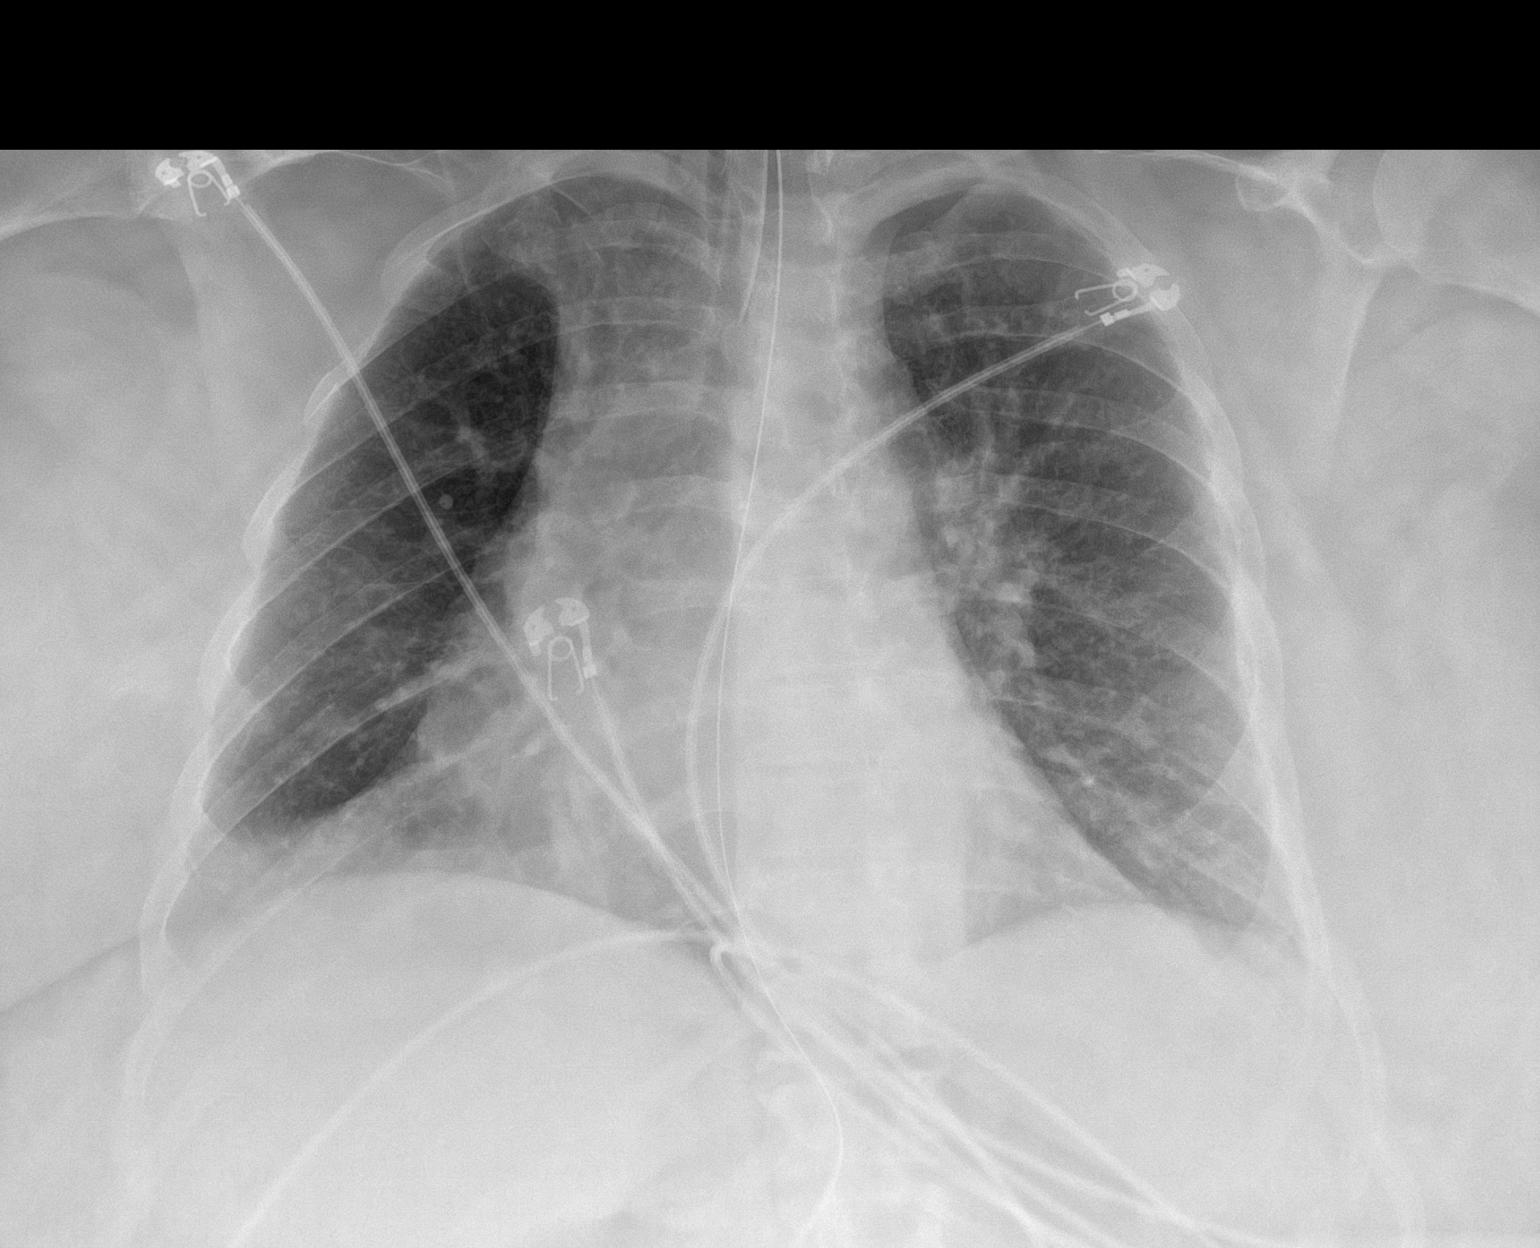

[1 of 1 positions shown; findings below may reference images not displayed]

FINDINGS: 7154 hours. Endotracheal tube tip is 3.6 cm above the base of the
carina. The NG tube passes into the stomach although the distal tip
position is not included on the film. The cardio pericardial
silhouette is enlarged. Interstitial markings are diffusely
coarsened with chronic features. Similar appearance of fat pad at
the right base, as confirmed on CT of 10/01/2019. Telemetry leads
overlie the chest.
IMPRESSION: Endotracheal tube tip is 3.6 cm above the base of the carina.

Chronic interstitial coarsening without acute cardiopulmonary
findings.

## 2022-10-19 IMAGING — CT CT RENAL STONE PROTOCOL
2 of 4 series · 15 of 46 positions shown, 17 images · non-contrast
Comparison: None.

CLINICAL DATA: Flank pain, nephrolithiasis

EXAM:
CT ABDOMEN AND PELVIS WITHOUT CONTRAST
TECHNIQUE: Multidetector CT imaging of the abdomen and pelvis was performed
following the standard protocol without IV contrast.

[Series 3: ap without · axial · non-contrast · 0.98mm/px · z∈[-486,-41]mm · 12 of 101 slices shown, 14 images]
[im 6/101  soft-tissue]
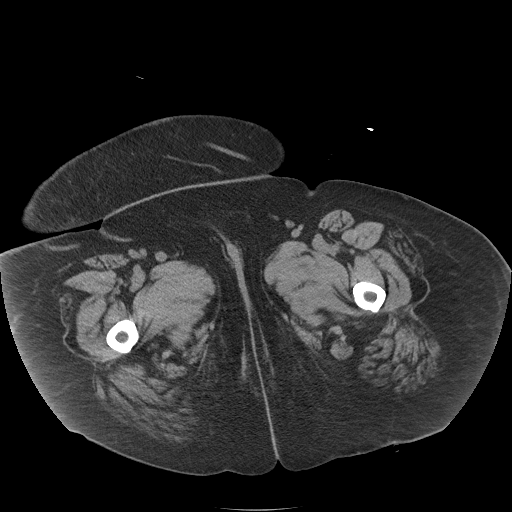
[im 6/101  bone]
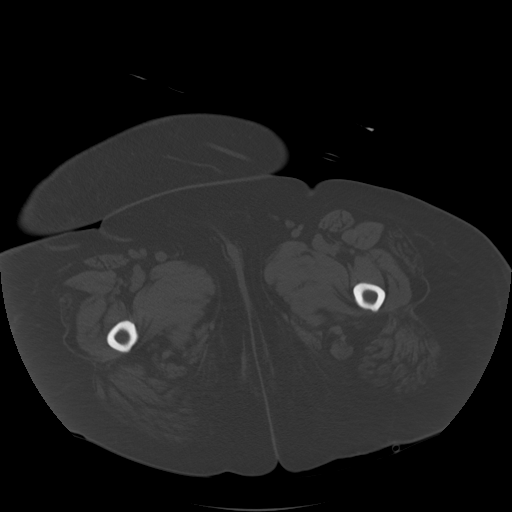
[im 17/101  soft-tissue]
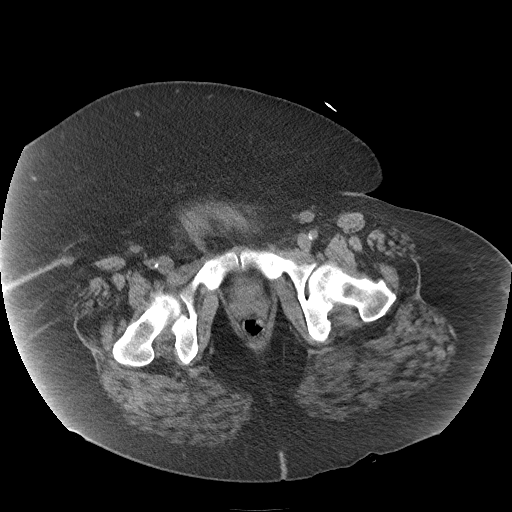
[im 23/101  soft-tissue]
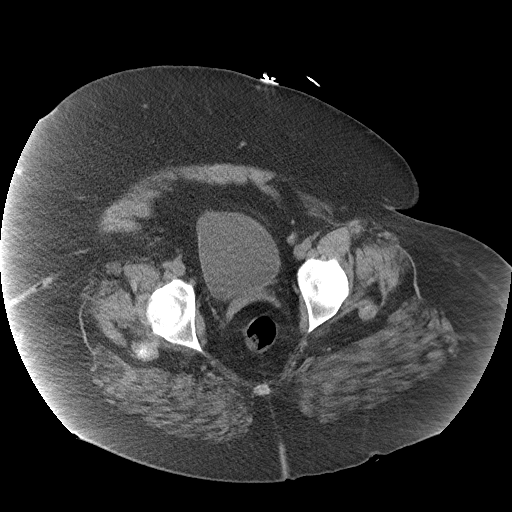
[im 28/101  soft-tissue]
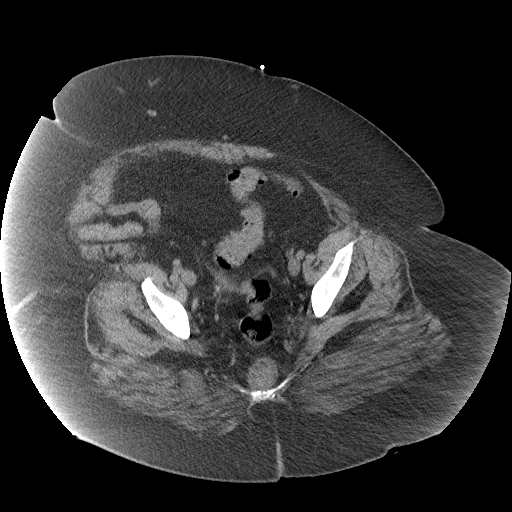
[im 39/101  soft-tissue]
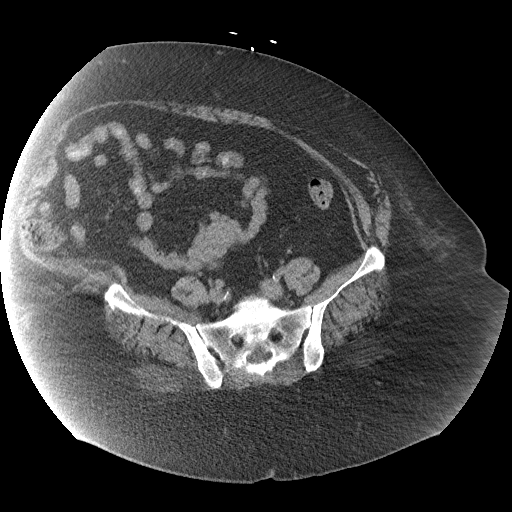
[im 45/101  soft-tissue]
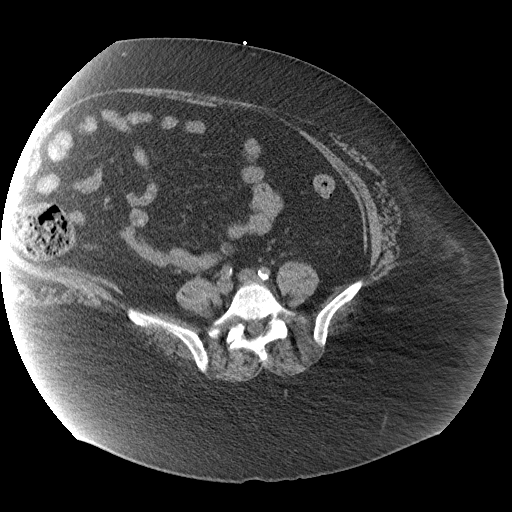
[im 56/101  soft-tissue]
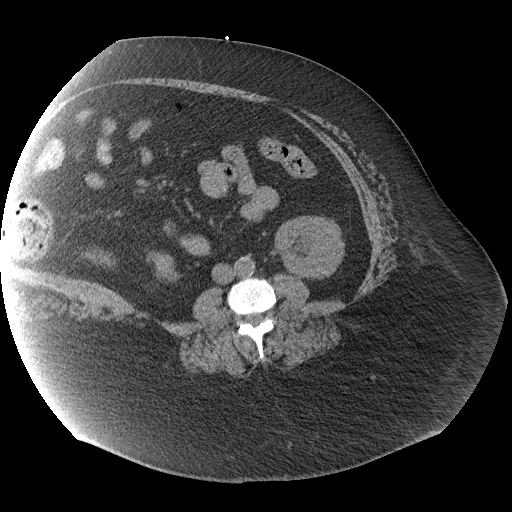
[im 62/101  soft-tissue]
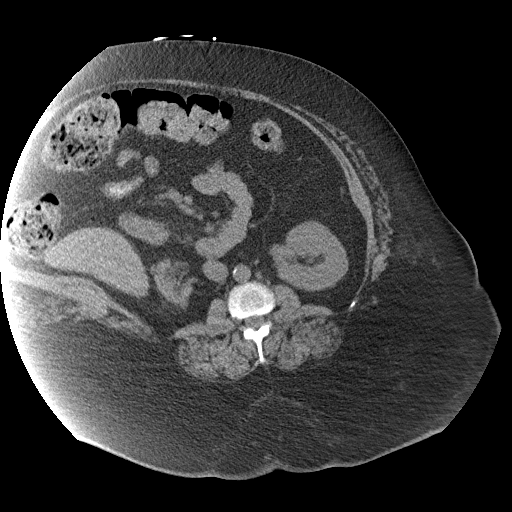
[im 73/101  soft-tissue]
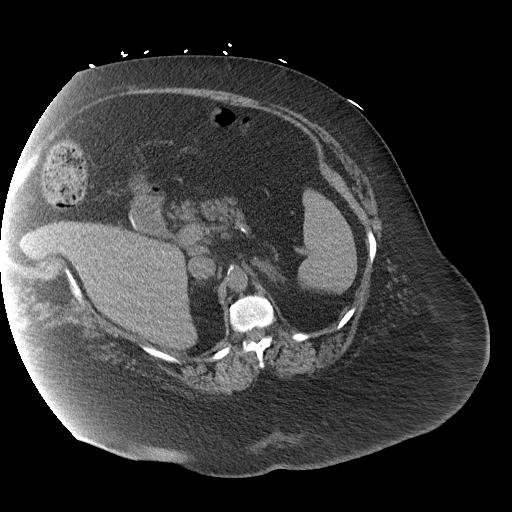
[im 73/101  bone]
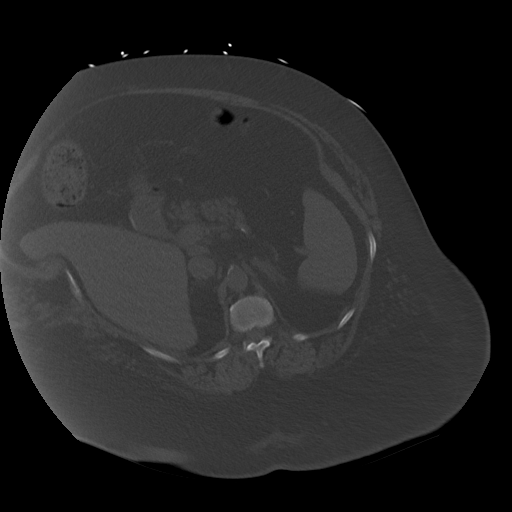
[im 78/101  soft-tissue]
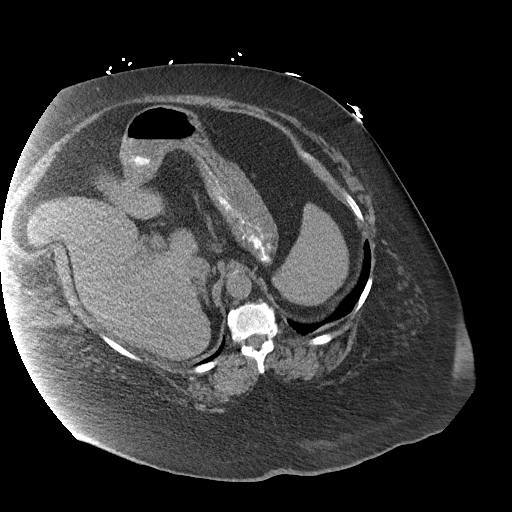
[im 84/101  soft-tissue]
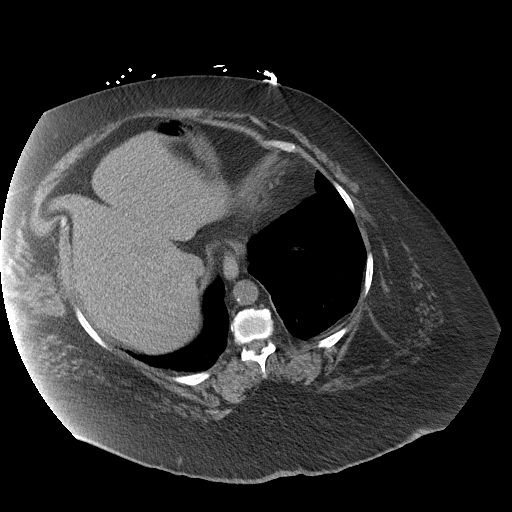
[im 95/101  soft-tissue]
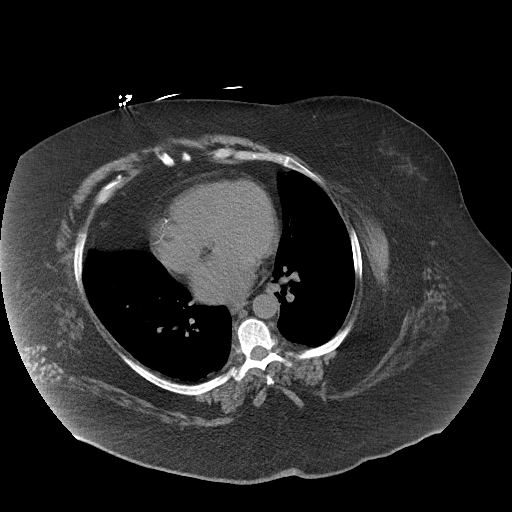

[Series 6: cor · coronal · 0.80mm/px · 3 of 146 slices shown]
[im 49/146  soft-tissue]
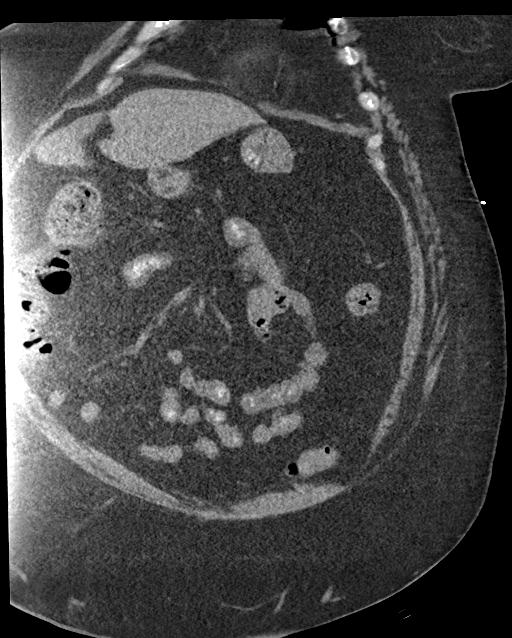
[im 65/146  soft-tissue]
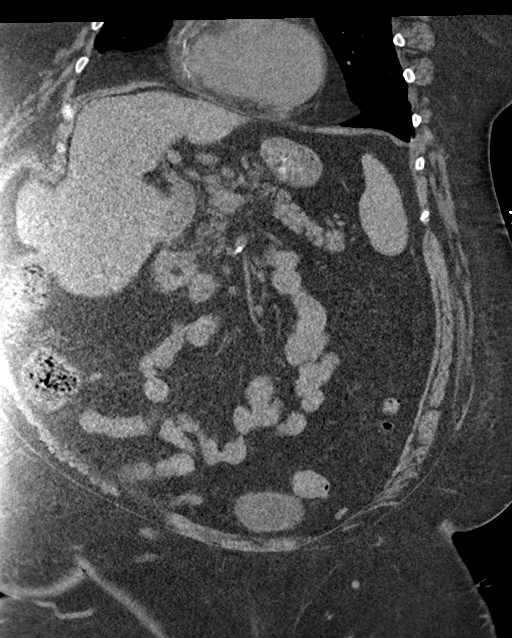
[im 81/146  soft-tissue]
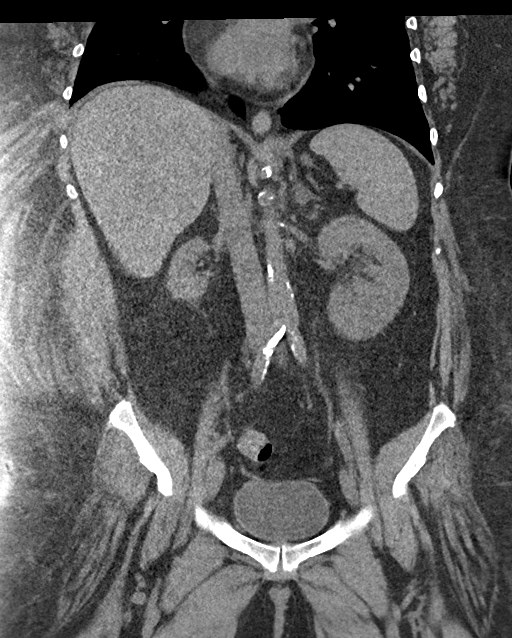

[15 of 46 positions shown; findings below may reference images not displayed]

FINDINGS: Lower chest: Visualized lung bases are clear bilaterally. Moderate
multi-vessel coronary artery calcification. Global cardiac size
within normal limits.

Hepatobiliary: No focal liver abnormality is seen. Status post
cholecystectomy. No biliary dilatation.

Pancreas: Unremarkable

Spleen: Unremarkable

Adrenals/Urinary Tract: The adrenal glands are unremarkable. The
right kidney is markedly atrophic with 2 nonobstructing calculi seen
within the lower pole measuring up to 8 mm. The left kidney is
compensatorially mildly enlarged and is normal in position. A 9 mm
nonobstructing calculus is seen within the lower pole. There is no
hydronephrosis. No ureteral calculi. The bladder is unremarkable.

Stomach/Bowel: Mild sigmoid diverticulosis. Moderate stool within
the colon. The small bowel and ascending colon are partially
excluded on this examination. The visualized stomach, small bowel,
and large bowel are otherwise unremarkable. Appendix normal. No free
intraperitoneal gas or fluid.

Vascular/Lymphatic: Moderate aortoiliac atherosclerotic
calcification. No aneurysm. No pathologic adenopathy within the
abdomen and pelvis.

Reproductive: Status post hysterectomy. No adnexal masses.

Other: No abdominal wall hernia identified. A soft tissue mass is
seen within the presacral space measuring 2.9 x 3.4 by 3.8 cm
demonstrating macroscopic internal fat. Given the internal fat,
differential considerations are led by benign lesions such as a
myelolipoma or hemangioma. Alternatively, this could represent
hypoenhancement internally within a schwannoma. A low-grade
liposarcoma could also appear in this fashion and is not excluded.

Musculoskeletal: No lytic or blastic bone lesions.
IMPRESSION: Marked atrophy of the right kidney with compensatory hypertrophy of
the left kidney.

Bilateral nonobstructing nephrolithiasis.  No urolithiasis.

3.8 cm rounded mass within the presacral space possibly containing
internal macroscopic fat or areas of hypoenhancement. Differential
considerations are led by benign lesion such as a myelolipoma,
hemangioma, or schwannoma. However, a fatty tumor such as a
liposarcoma could appear similarly. Comparison with prior
examinations would help establish the chronicity of this lesion and
differentiate these entities. Alternatively, this could be further
assessed with MRI examination, if indicated.
